# Patient Record
Sex: Female | Born: 1965 | Race: Black or African American | Hispanic: No | Marital: Single | Smoking: Current every day smoker
Health system: Southern US, Community
[De-identification: ages and names within clinical notes are randomized; demographics above are authoritative.]

## PROBLEM LIST (undated history)

## (undated) DIAGNOSIS — F329 Major depressive disorder, single episode, unspecified: Secondary | ICD-10-CM

## (undated) DIAGNOSIS — F1410 Cocaine abuse, uncomplicated: Secondary | ICD-10-CM

## (undated) DIAGNOSIS — E079 Disorder of thyroid, unspecified: Secondary | ICD-10-CM

## (undated) DIAGNOSIS — E785 Hyperlipidemia, unspecified: Secondary | ICD-10-CM

## (undated) DIAGNOSIS — M5126 Other intervertebral disc displacement, lumbar region: Secondary | ICD-10-CM

## (undated) DIAGNOSIS — E119 Type 2 diabetes mellitus without complications: Secondary | ICD-10-CM

## (undated) DIAGNOSIS — M51369 Other intervertebral disc degeneration, lumbar region without mention of lumbar back pain or lower extremity pain: Secondary | ICD-10-CM

## (undated) DIAGNOSIS — I10 Essential (primary) hypertension: Secondary | ICD-10-CM

## (undated) DIAGNOSIS — F32A Depression, unspecified: Secondary | ICD-10-CM

## (undated) DIAGNOSIS — E7800 Pure hypercholesterolemia, unspecified: Secondary | ICD-10-CM

## (undated) DIAGNOSIS — M5430 Sciatica, unspecified side: Secondary | ICD-10-CM

## (undated) DIAGNOSIS — M5136 Other intervertebral disc degeneration, lumbar region: Secondary | ICD-10-CM

## (undated) DIAGNOSIS — J449 Chronic obstructive pulmonary disease, unspecified: Secondary | ICD-10-CM

## (undated) DIAGNOSIS — K8020 Calculus of gallbladder without cholecystitis without obstruction: Secondary | ICD-10-CM

## (undated) HISTORY — PX: KNEE SURGERY: SHX244

## (undated) HISTORY — PX: TUBAL LIGATION: SHX77

## (undated) HISTORY — PX: HERNIA REPAIR: SHX51

---

## 2014-01-10 ENCOUNTER — Emergency Department (HOSPITAL_BASED_OUTPATIENT_CLINIC_OR_DEPARTMENT_OTHER): Payer: Medicaid Other

## 2014-01-10 ENCOUNTER — Encounter (HOSPITAL_BASED_OUTPATIENT_CLINIC_OR_DEPARTMENT_OTHER): Payer: Self-pay | Admitting: Emergency Medicine

## 2014-01-10 ENCOUNTER — Emergency Department (HOSPITAL_BASED_OUTPATIENT_CLINIC_OR_DEPARTMENT_OTHER)
Admission: EM | Admit: 2014-01-10 | Discharge: 2014-01-10 | Disposition: A | Discharge status: Home / Self Care | Payer: Medicaid Other | Attending: Emergency Medicine | Admitting: Emergency Medicine

## 2014-01-10 DIAGNOSIS — M545 Low back pain, unspecified: Secondary | ICD-10-CM | POA: Insufficient documentation

## 2014-01-10 DIAGNOSIS — E785 Hyperlipidemia, unspecified: Secondary | ICD-10-CM | POA: Insufficient documentation

## 2014-01-10 DIAGNOSIS — F329 Major depressive disorder, single episode, unspecified: Secondary | ICD-10-CM | POA: Insufficient documentation

## 2014-01-10 DIAGNOSIS — F17200 Nicotine dependence, unspecified, uncomplicated: Secondary | ICD-10-CM | POA: Insufficient documentation

## 2014-01-10 DIAGNOSIS — F3289 Other specified depressive episodes: Secondary | ICD-10-CM | POA: Insufficient documentation

## 2014-01-10 DIAGNOSIS — R109 Unspecified abdominal pain: Secondary | ICD-10-CM | POA: Insufficient documentation

## 2014-01-10 DIAGNOSIS — Z79899 Other long term (current) drug therapy: Secondary | ICD-10-CM | POA: Insufficient documentation

## 2014-01-10 DIAGNOSIS — M549 Dorsalgia, unspecified: Secondary | ICD-10-CM

## 2014-01-10 HISTORY — DX: Hyperlipidemia, unspecified: E78.5

## 2014-01-10 HISTORY — DX: Depression, unspecified: F32.A

## 2014-01-10 HISTORY — DX: Major depressive disorder, single episode, unspecified: F32.9

## 2014-01-10 LAB — COMPREHENSIVE METABOLIC PANEL
ALBUMIN: 4.2 g/dL (ref 3.5–5.2)
ALT: 40 U/L — ABNORMAL HIGH (ref 0–35)
AST: 45 U/L — AB (ref 0–37)
Alkaline Phosphatase: 84 U/L (ref 39–117)
BUN: 10 mg/dL (ref 6–23)
CALCIUM: 10.6 mg/dL — AB (ref 8.4–10.5)
CO2: 24 mEq/L (ref 19–32)
CREATININE: 0.8 mg/dL (ref 0.50–1.10)
Chloride: 100 mEq/L (ref 96–112)
GFR calc Af Amer: >90 mL/min (ref >90)
GFR calc non Af Amer: 85 mL/min — ABNORMAL LOW (ref >90)
Glucose, Bld: 97 mg/dL (ref 70–99)
Potassium: 3.9 mEq/L (ref 3.7–5.3)
Sodium: 137 mEq/L (ref 137–147)
Total Bilirubin: 0.5 mg/dL (ref 0.3–1.2)
Total Protein: 8.1 g/dL (ref 6.0–8.3)

## 2014-01-10 LAB — URINALYSIS, ROUTINE W REFLEX MICROSCOPIC
Bilirubin Urine: NEGATIVE
GLUCOSE, UA: NEGATIVE mg/dL
HGB URINE DIPSTICK: NEGATIVE
Ketones, ur: NEGATIVE mg/dL
Nitrite: NEGATIVE
Protein, ur: NEGATIVE mg/dL
SPECIFIC GRAVITY, URINE: 1.014 (ref 1.005–1.030)
Urobilinogen, UA: 0.2 mg/dL (ref 0.0–1.0)
pH: 6 (ref 5.0–8.0)

## 2014-01-10 LAB — CBC WITH DIFFERENTIAL/PLATELET
BASOS ABS: 0 10*3/uL (ref 0.0–0.1)
BASOS PCT: 0 % (ref 0–1)
EOS PCT: 1 % (ref 0–5)
Eosinophils Absolute: 0.1 10*3/uL (ref 0.0–0.7)
HEMATOCRIT: 41.8 % (ref 36.0–46.0)
Hemoglobin: 14.4 g/dL (ref 12.0–15.0)
Lymphocytes Relative: 43 % (ref 12–46)
Lymphs Abs: 4.6 10*3/uL — ABNORMAL HIGH (ref 0.7–4.0)
MCH: 29.1 pg (ref 26.0–34.0)
MCHC: 34.4 g/dL (ref 30.0–36.0)
MCV: 84.6 fL (ref 78.0–100.0)
MONO ABS: 1 10*3/uL (ref 0.1–1.0)
Monocytes Relative: 10 % (ref 3–12)
NEUTROS ABS: 4.9 10*3/uL (ref 1.7–7.7)
Neutrophils Relative %: 46 % (ref 43–77)
Platelets: 307 10*3/uL (ref 150–400)
RBC: 4.94 MIL/uL (ref 3.87–5.11)
RDW: 16.6 % — AB (ref 11.5–15.5)
WBC: 10.7 10*3/uL — ABNORMAL HIGH (ref 4.0–10.5)

## 2014-01-10 LAB — URINE MICROSCOPIC-ADD ON

## 2014-01-10 MED ORDER — METHOCARBAMOL 100 MG/ML IJ SOLN
1000.0000 mg | Freq: Once | INTRAMUSCULAR | Status: AC
  Administered 2014-01-10: 1000 mg via INTRAMUSCULAR
  Filled 2014-01-10: qty 10

## 2014-01-10 MED ORDER — ONDANSETRON HCL 4 MG/2ML IJ SOLN
4.0000 mg | Freq: Once | INTRAMUSCULAR | Status: AC
  Administered 2014-01-10: 4 mg via INTRAVENOUS
  Filled 2014-01-10: qty 2

## 2014-01-10 MED ORDER — HYDROCODONE-ACETAMINOPHEN 5-325 MG PO TABS: 1.0000 | ORAL_TABLET | ORAL | Status: DC | PRN

## 2014-01-10 MED ORDER — CYCLOBENZAPRINE HCL 10 MG PO TABS: 10.0000 mg | ORAL_TABLET | Freq: Two times a day (BID) | ORAL | Status: DC | PRN

## 2014-01-10 MED ORDER — HYDROMORPHONE HCL PF 1 MG/ML IJ SOLN
0.5000 mg | Freq: Once | INTRAMUSCULAR | Status: AC
  Administered 2014-01-10: 0.5 mg via INTRAVENOUS
  Filled 2014-01-10: qty 1

## 2014-01-10 NOTE — ED Notes (Signed)
pa at bedside. 

## 2014-01-10 NOTE — ED Notes (Signed)
PA at bedside.

## 2014-01-10 NOTE — ED Notes (Signed)
Pt c/o lower back pain which radiates down left leg  X 1 day

## 2014-01-10 NOTE — Discharge Instructions (Signed)
Back Pain, Adult Low back pain is very common. About 1 in 5 people have back pain.The cause of low back pain is rarely dangerous. The pain often gets better over time.About half of people with a sudden onset of back pain feel better in just 2 weeks. About 8 in 10 people feel better by 6 weeks.  CAUSES Some common causes of back pain include:  Strain of the muscles or ligaments supporting the spine.  Wear and tear (degeneration) of the spinal discs.  Arthritis.  Direct injury to the back. DIAGNOSIS Most of the time, the direct cause of low back pain is not known.However, back pain can be treated effectively even when the exact cause of the pain is unknown.Answering your caregiver's questions about your overall health and symptoms is one of the most accurate ways to make sure the cause of your pain is not dangerous. If your caregiver needs more information, he or she may order lab work or imaging tests (X-rays or MRIs).However, even if imaging tests show changes in your back, this usually does not require surgery. HOME CARE INSTRUCTIONS For many people, back pain returns.Since low back pain is rarely dangerous, it is often a condition that people can learn to manageon their own.   Remain active. It is stressful on the back to sit or stand in one place. Do not sit, drive, or stand in one place for more than 30 minutes at a time. Take short walks on level surfaces as soon as pain allows.Try to increase the length of time you walk each day.  Do not stay in bed.Resting more than 1 or 2 days can delay your recovery.  Do not avoid exercise or work.Your body is made to move.It is not dangerous to be active, even though your back may hurt.Your back will likely heal faster if you return to being active before your pain is gone.  Pay attention to your body when you bend and lift. Many people have less discomfortwhen lifting if they bend their knees, keep the load close to their bodies,and  avoid twisting. Often, the most comfortable positions are those that put less stress on your recovering back.  Find a comfortable position to sleep. Use a firm mattress and lie on your side with your knees slightly bent. If you lie on your back, put a pillow under your knees.  Only take over-the-counter or prescription medicines as directed by your caregiver. Over-the-counter medicines to reduce pain and inflammation are often the most helpful.Your caregiver may prescribe muscle relaxant drugs.These medicines help dull your pain so you can more quickly return to your normal activities and healthy exercise.  Put ice on the injured area.  Put ice in a plastic bag.  Place a towel between your skin and the bag.  Leave the ice on for 15-20 minutes, 03-04 times a day for the first 2 to 3 days. After that, ice and heat may be alternated to reduce pain and spasms.  Ask your caregiver about trying back exercises and gentle massage. This may be of some benefit.  Avoid feeling anxious or stressed.Stress increases muscle tension and can worsen back pain.It is important to recognize when you are anxious or stressed and learn ways to manage it.Exercise is a great option. SEEK MEDICAL CARE IF:  You have pain that is not relieved with rest or medicine.  You have pain that does not improve in 1 week.  You have new symptoms.  You are generally not feeling well. SEEK   IMMEDIATE MEDICAL CARE IF:   You have pain that radiates from your back into your legs.  You develop new bowel or bladder control problems.  You have unusual weakness or numbness in your arms or legs.  You develop nausea or vomiting.  You develop abdominal pain.  You feel faint. Document Released: 09/08/2005 Document Revised: 03/09/2012 Document Reviewed: 01/27/2011 [employer] Patient Information 2014 [employer].  

## 2014-01-13 NOTE — ED Provider Notes (Signed)
CSN: [id number]     Arrival date & time 01/10/14  1740 History   First MD Initiated Contact with Patient 01/10/14 1759     Chief Complaint  Patient presents with  . Back Pain     (Consider location/radiation/quality/duration/timing/severity/associated sxs/prior Treatment) Patient is a 48 y.o. female presenting with back pain. The history is provided by the patient. No language interpreter was used.  Back Pain Worsened by:  Movement and ambulation Associated symptoms: no abdominal pain, no dysuria, no fever, no headaches and no numbness   Associated symptoms comment:  Left sided lower back and flank pain since yesterday. The pain moves into left lower extremity without numbness, or weakness. She denies known injury or history of back problems. No dysuria or fever.    Past Medical History  Diagnosis Date  . Hyperlipemia   . Depression    Past Surgical History  Procedure Laterality Date  . Tubal ligation     History reviewed. No pertinent family history. History  Substance Use Topics  . Smoking status: Current Every Day Smoker -- 0.50 packs/day    Types: Cigarettes  . Smokeless tobacco: Not on file  . Alcohol Use: No   OB History   Grav Para Term Preterm Abortions TAB SAB Ect Mult Living                 Review of Systems  Constitutional: Negative for fever and chills.  Respiratory: Negative.  Negative for cough and shortness of breath.   Cardiovascular: Negative.   Gastrointestinal: Negative.  Negative for nausea, vomiting and abdominal pain.  Genitourinary: Positive for flank pain. Negative for dysuria and hematuria.  Musculoskeletal: Positive for back pain.  Skin: Negative.   Neurological: Negative.  Negative for numbness and headaches.      Allergies  Asa; Bactrim; Flagyl; and Sulfa antibiotics  Home Medications   Prior to Admission medications   Medication Sig Start Date End Date Taking? Authorizing Provider  escitalopram (LEXAPRO) 10 MG tablet Take 10 mg  by mouth daily.   Yes Historical Provider, MD  simvastatin (ZOCOR) 20 MG tablet Take 20 mg by mouth daily.   Yes Historical Provider, MD  cyclobenzaprine (FLEXERIL) 10 MG tablet Take 1 tablet (10 mg total) by mouth 2 (two) times daily as needed for muscle spasms. 01/10/14   Normand Damron A Deunta Beneke, PA-C  HYDROcodone-acetaminophen (NORCO/VICODIN) 5-325 MG per tablet Take 1-2 tablets by mouth every 4 (four) hours as needed. 01/10/14   Blane Worthington A Timoth Schara, PA-C   BP 117/78  Pulse 94  Temp(Src) 98.7 F (37.1 C) (Oral)  Resp 16  Ht 5\' 3"  (1.6 m)  Wt 162 lb (73.483 kg)  BMI 28.70 kg/m2  SpO2 100%  LMP 12/18/2013 Physical Exam  Constitutional: She is oriented to person, place, and time. She appears well-developed and well-nourished.  HENT:  Head: Normocephalic.  Neck: Normal range of motion. Neck supple.  Cardiovascular: Normal rate and regular rhythm.   Pulmonary/Chest: Effort normal and breath sounds normal.  Abdominal: Soft. Bowel sounds are normal. There is no tenderness. There is no rebound and no guarding.  Genitourinary:  No distinct CVA tenderness.  Musculoskeletal: Normal range of motion.  Mildly tender left paralumbar area without swelling. No left sciatic tenderness. FROM lower extremities without increasing back pain.   Neurological: She is alert and oriented to person, place, and time.  Skin: Skin is warm and dry. No rash noted.  Psychiatric: She has a normal mood and affect.    ED Course  Procedures (including critical care time) Labs Review Labs Reviewed  CBC WITH DIFFERENTIAL - Abnormal; Notable for the following:    WBC 10.7 (*)    RDW 16.6 (*)    Lymphs Abs 4.6 (*)    All other components within normal limits  COMPREHENSIVE METABOLIC PANEL - Abnormal; Notable for the following:    Calcium 10.6 (*)    AST 45 (*)    ALT 40 (*)    GFR calc non Af Amer 85 (*)    All other components within normal limits  URINALYSIS, ROUTINE W REFLEX MICROSCOPIC - Abnormal; Notable for the  following:    Leukocytes, UA TRACE (*)    All other components within normal limits  URINE MICROSCOPIC-ADD ON - Abnormal; Notable for the following:    Bacteria, UA FEW (*)    All other components within normal limits   Ct Abdomen Pelvis Wo Contrast  01/10/2014   CLINICAL DATA:  BACK PAIN  EXAM: CT ABDOMEN AND PELVIS WITHOUT CONTRAST  TECHNIQUE: Multidetector CT imaging of the abdomen and pelvis was performed following the standard protocol without intravenous contrast.  COMPARISON:  None.  FINDINGS: The lung bases are unremarkable.  Noncontrast evaluation of the liver, spleen, adrenals, pancreas, kidneys is unremarkable.  The bowel is negative.  Within the limitations of a noncontrast CT there is no evidence of abdominal or pelvic free fluid, loculated fluid collections, masses, nor adenopathy. There is no evidence of an abdominal aortic aneurysm. No abdominal wall nor inguinal hernia is appreciated.  There are are no aggressive appearing osseous lesions.  Atherosclerotic calcifications are identified within the aorta.  IMPRESSION: Within the limitations of noncontrast CT there is no evidence of abdominal or pelvic pathology. No CT evidence accounting for the patient's clinical presentation.   Electronically Signed   By: Salome HolmesHector  Cooper M.D.   On: 01/10/2014 21:00   Imaging Review No results found.   EKG Interpretation None      MDM   Final diagnoses:  Back pain   No CT evidence ureteral stone. The pain is worse with movement. She is afebrile and pain is improved in ED. Feel symptoms are musculoskeletal in nature.      Arnoldo HookerShari A Aneesh Faller, PA-C 01/14/14 2343

## 2014-01-17 NOTE — ED Provider Notes (Signed)
History/physical exam/procedure(s) were performed by non-physician practitioner and as supervising physician I was immediately available for consultation/collaboration. I have reviewed all notes and am in agreement with care and plan.   Hilario Quarryanielle S Martyna Thorns, MD 01/17/14 1341

## 2014-05-02 ENCOUNTER — Emergency Department (HOSPITAL_BASED_OUTPATIENT_CLINIC_OR_DEPARTMENT_OTHER)
Admission: EM | Admit: 2014-05-02 | Discharge: 2014-05-02 | Disposition: A | Discharge status: Home / Self Care | Payer: Medicaid Other | Attending: Emergency Medicine | Admitting: Emergency Medicine

## 2014-05-02 ENCOUNTER — Emergency Department (HOSPITAL_BASED_OUTPATIENT_CLINIC_OR_DEPARTMENT_OTHER): Payer: Medicaid Other

## 2014-05-02 ENCOUNTER — Encounter (HOSPITAL_BASED_OUTPATIENT_CLINIC_OR_DEPARTMENT_OTHER): Payer: Self-pay | Admitting: Emergency Medicine

## 2014-05-02 DIAGNOSIS — F3289 Other specified depressive episodes: Secondary | ICD-10-CM | POA: Diagnosis not present

## 2014-05-02 DIAGNOSIS — Y92009 Unspecified place in unspecified non-institutional (private) residence as the place of occurrence of the external cause: Secondary | ICD-10-CM | POA: Diagnosis not present

## 2014-05-02 DIAGNOSIS — S300XXA Contusion of lower back and pelvis, initial encounter: Secondary | ICD-10-CM | POA: Diagnosis not present

## 2014-05-02 DIAGNOSIS — Z79899 Other long term (current) drug therapy: Secondary | ICD-10-CM | POA: Diagnosis not present

## 2014-05-02 DIAGNOSIS — R296 Repeated falls: Secondary | ICD-10-CM | POA: Insufficient documentation

## 2014-05-02 DIAGNOSIS — W19XXXA Unspecified fall, initial encounter: Secondary | ICD-10-CM

## 2014-05-02 DIAGNOSIS — Y939 Activity, unspecified: Secondary | ICD-10-CM | POA: Insufficient documentation

## 2014-05-02 DIAGNOSIS — F17200 Nicotine dependence, unspecified, uncomplicated: Secondary | ICD-10-CM | POA: Diagnosis not present

## 2014-05-02 DIAGNOSIS — IMO0002 Reserved for concepts with insufficient information to code with codable children: Secondary | ICD-10-CM | POA: Insufficient documentation

## 2014-05-02 DIAGNOSIS — F329 Major depressive disorder, single episode, unspecified: Secondary | ICD-10-CM | POA: Insufficient documentation

## 2014-05-02 DIAGNOSIS — E785 Hyperlipidemia, unspecified: Secondary | ICD-10-CM | POA: Insufficient documentation

## 2014-05-02 MED ORDER — OXYCODONE-ACETAMINOPHEN 5-325 MG PO TABS: 1.0000 | ORAL_TABLET | Freq: Four times a day (QID) | ORAL | Status: DC | PRN

## 2014-05-02 MED ORDER — OXYCODONE-ACETAMINOPHEN 5-325 MG PO TABS
1.0000 | ORAL_TABLET | Freq: Once | ORAL | Status: AC
  Administered 2014-05-02: 1 via ORAL
  Filled 2014-05-02: qty 1

## 2014-05-02 NOTE — Discharge Instructions (Signed)
Return to the ED with any concerns including increased pain, weakness of legs, not able to bear weight, decreased level of alertness/lethargy, or any other alarming symptoms

## 2014-05-02 NOTE — ED Provider Notes (Signed)
CSN: [id number]     Arrival date & time 05/02/14  1629 History   First MD Initiated Contact with Patient 05/02/14 1823     This chart was scribed for Ethelda ChickMartha K Linker, MD by Arlan OrganAshley Leger, ED Scribe. This patient was seen in room MH04/MH04 and the patient's care was started 6:37 PM.   Chief Complaint  Patient presents with  . Fall   The history is provided by the patient. No language interpreter was used.    HPI Comments: Judy Schmidt is a 48 y.o. female with a PMHx of hyperlipidemia and depression who presents to the Emergency Department complaining of a fall that occurred just prior to arrival. Pt states she was in her kitchen when she felt unsteady resulting in her falling. Pt now c/o constant, moderate pain over the R buttock/R hip, and back that is unchanged at this time. She denies any fever or chills currently or at time of fall. No numbness, loss of sensation, or paresthesia. She has tried prescribed Hydrocodone for previous chronic pain without any improvement for symptoms. Last dose around noon today. Pt admits to feeling unbalanced and unsteady intermittently onset April 2015. PCP is aware of new symptoms but unaware of diagnosis at this time. Pt reports undergoing multiple X-ray, brain scan, bone scan, and MRI for this concern without any abnormal findings; still awaiting brain scan results. Pt with known allergy to ASA, Bactrim, Flagyl, and Sulfa Antibiotics. No other concerns this visit.    Past Medical History  Diagnosis Date  . Hyperlipemia   . Depression    Past Surgical History  Procedure Laterality Date  . Tubal ligation     No family history on file. History  Substance Use Topics  . Smoking status: Current Every Day Smoker -- 0.50 packs/day    Types: Cigarettes  . Smokeless tobacco: Not on file  . Alcohol Use: No   OB History   Grav Para Term Preterm Abortions TAB SAB Ect Mult Living                 Review of Systems  Constitutional: Negative for fever and  chills.  Musculoskeletal: Positive for arthralgias and back pain.  Neurological: Negative for numbness.  ROS reviewed and all otherwise negative except for mentioned in HPI    Allergies  Asa; Bactrim; Flagyl; and Sulfa antibiotics  Home Medications   Prior to Admission medications   Medication Sig Start Date End Date Taking? Authorizing Provider  clotrimazole (LOTRIMIN) 1 % cream Apply 1 application topically 2 (two) times daily.   Yes Historical Provider, MD  Pregabalin (LYRICA PO) Take by mouth.   Yes Historical Provider, MD  cyclobenzaprine (FLEXERIL) 10 MG tablet Take 1 tablet (10 mg total) by mouth 2 (two) times daily as needed for muscle spasms. 01/10/14   Shari A Upstill, PA-C  escitalopram (LEXAPRO) 10 MG tablet Take 10 mg by mouth daily.    Historical Provider, MD  HYDROcodone-acetaminophen (NORCO/VICODIN) 5-325 MG per tablet Take 1-2 tablets by mouth every 4 (four) hours as needed. 01/10/14   Shari A Upstill, PA-C  oxyCODONE-acetaminophen (PERCOCET/ROXICET) 5-325 MG per tablet Take 1-2 tablets by mouth every 6 (six) hours as needed for severe pain. 05/02/14   Ethelda ChickMartha K Linker, MD  simvastatin (ZOCOR) 20 MG tablet Take 20 mg by mouth daily.    Historical Provider, MD   Triage Vitals: BP 125/65  Pulse 98  Temp(Src) 99.1 F (37.3 C) (Oral)  Resp 20  Ht 5\' 3"  (1.6 m)  Wt 164 lb (74.39 kg)  BMI 29.06 kg/m2  SpO2 100%  LMP 04/11/2014   Physical Exam  Nursing note and vitals reviewed. Constitutional: She is oriented to person, place, and time. She appears well-developed and well-nourished.  HENT:  Head: Normocephalic.  Eyes: EOM are normal.  Neck: Normal range of motion.  Pulmonary/Chest: Effort normal.  Abdominal: She exhibits no distension.  Musculoskeletal: Normal range of motion. She exhibits tenderness.  No pain with ROM of hip or knee Mild midline lumbar tenderness; chronic per pt  Neurological: She is alert and oriented to person, place, and time.  Skin:  R bottck  with 4 cm diameter area of contusion and induration with superficial abrasion overlying  Psychiatric: She has a normal mood and affect.    ED Course  Procedures (including critical care time)  DIAGNOSTIC STUDIES: Oxygen Saturation is 100% on RA, Normal by my interpretation.    COORDINATION OF CARE: 6:45 PM- Will give Percocet. Will order DG pelvis 1-2 views and DG lumbar spine complete. Discussed treatment plan with pt at bedside and pt agreed to plan.     Labs Review Labs Reviewed - No data to display  Imaging Review Dg Lumbar Spine Complete  05/02/2014   CLINICAL DATA:  Status post fall now with right buttock and low back pain; patient has numbness in the left leg.  EXAM: LUMBAR SPINE - COMPLETE 4+ VIEW  COMPARISON:  AP view of the pelvis of today's date and a nuclear bone scan dated April 04, 2014.  FINDINGS: The lumbar vertebral bodies are preserved in height. The intervertebral disc space heights are well maintained. There is no spondylolisthesis. The pedicles and transverse processes are intact. The observed portions of the sacrum are normal.  IMPRESSION: There is no acute bony abnormality of the lumbar spine.   Electronically Signed   By: David  [place]   On: 05/02/2014 19:30   Dg Pelvis 1-2 Views  05/02/2014   CLINICAL DATA:  Status post fall with persistent right buttock pain; also numbness in the right leg  EXAM: PELVIS - 1-2 VIEW  COMPARISON:  Lumbar spine series of today's date and a nuclear bone scan of April 04, 2014  FINDINGS: The bony pelvis is adequately mineralized. There is no acute fracture nor dislocation. The hip joint spaces are reasonably well maintained. The observed portions of the proximal femurs are normal. The sacrum exhibits no acute abnormality.  Specific attention to the left hemipelvis in the region of focal increased uptake in the superior pubic ramus reveals no plain film abnormality.  IMPRESSION: There is no acute bony abnormality of the pelvis.   Electronically  Signed   By: David  [place]   On: 05/02/2014 19:25     EKG Interpretation None      MDM   Final diagnoses:  Contusion of buttock, initial encounter  Fall, initial encounter    Pt presenting fall with contusion on buttock.  xrays reassuring.  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  She states this is chronic in nature but does lead to her having falls.  Discharged with strict return precautions.  Pt agreeable with plan.  Nursing notes including past medical history and social history reviewed and considered in documentation   I personally performed the services described in this documentation, which was scribed in my presence. The recorded information has been reviewed and is accurate.    Ethelda Chick, MD 05/03/14 [phone]

## 2014-05-02 NOTE — ED Notes (Signed)
Pt reports hx"serious problems with my mobility" and multiple falls-pain right buttock and hip area

## 2014-06-13 ENCOUNTER — Encounter (HOSPITAL_BASED_OUTPATIENT_CLINIC_OR_DEPARTMENT_OTHER): Payer: Self-pay | Admitting: Emergency Medicine

## 2014-06-13 ENCOUNTER — Emergency Department (HOSPITAL_BASED_OUTPATIENT_CLINIC_OR_DEPARTMENT_OTHER): Payer: Medicaid Other

## 2014-06-13 ENCOUNTER — Emergency Department (HOSPITAL_BASED_OUTPATIENT_CLINIC_OR_DEPARTMENT_OTHER)
Admission: EM | Admit: 2014-06-13 | Discharge: 2014-06-13 | Disposition: A | Discharge status: Home / Self Care | Payer: Medicaid Other | Attending: Emergency Medicine | Admitting: Emergency Medicine

## 2014-06-13 DIAGNOSIS — E8779 Other fluid overload: Secondary | ICD-10-CM | POA: Diagnosis not present

## 2014-06-13 DIAGNOSIS — E785 Hyperlipidemia, unspecified: Secondary | ICD-10-CM | POA: Diagnosis not present

## 2014-06-13 DIAGNOSIS — Z79899 Other long term (current) drug therapy: Secondary | ICD-10-CM | POA: Diagnosis not present

## 2014-06-13 DIAGNOSIS — R5381 Other malaise: Secondary | ICD-10-CM | POA: Diagnosis not present

## 2014-06-13 DIAGNOSIS — IMO0002 Reserved for concepts with insufficient information to code with codable children: Secondary | ICD-10-CM | POA: Diagnosis not present

## 2014-06-13 DIAGNOSIS — F329 Major depressive disorder, single episode, unspecified: Secondary | ICD-10-CM | POA: Diagnosis not present

## 2014-06-13 DIAGNOSIS — R55 Syncope and collapse: Secondary | ICD-10-CM | POA: Insufficient documentation

## 2014-06-13 DIAGNOSIS — M546 Pain in thoracic spine: Secondary | ICD-10-CM | POA: Diagnosis not present

## 2014-06-13 DIAGNOSIS — E7800 Pure hypercholesterolemia, unspecified: Secondary | ICD-10-CM | POA: Diagnosis not present

## 2014-06-13 DIAGNOSIS — E8770 Fluid overload, unspecified: Secondary | ICD-10-CM

## 2014-06-13 DIAGNOSIS — M7989 Other specified soft tissue disorders: Secondary | ICD-10-CM | POA: Diagnosis present

## 2014-06-13 DIAGNOSIS — R5383 Other fatigue: Secondary | ICD-10-CM

## 2014-06-13 DIAGNOSIS — R51 Headache: Secondary | ICD-10-CM | POA: Diagnosis not present

## 2014-06-13 DIAGNOSIS — M5430 Sciatica, unspecified side: Secondary | ICD-10-CM | POA: Diagnosis not present

## 2014-06-13 DIAGNOSIS — M542 Cervicalgia: Secondary | ICD-10-CM | POA: Insufficient documentation

## 2014-06-13 DIAGNOSIS — F3289 Other specified depressive episodes: Secondary | ICD-10-CM | POA: Insufficient documentation

## 2014-06-13 DIAGNOSIS — F17200 Nicotine dependence, unspecified, uncomplicated: Secondary | ICD-10-CM | POA: Insufficient documentation

## 2014-06-13 DIAGNOSIS — M5410 Radiculopathy, site unspecified: Secondary | ICD-10-CM

## 2014-06-13 DIAGNOSIS — M5442 Lumbago with sciatica, left side: Secondary | ICD-10-CM

## 2014-06-13 DIAGNOSIS — M5441 Lumbago with sciatica, right side: Secondary | ICD-10-CM

## 2014-06-13 HISTORY — DX: Disorder of thyroid, unspecified: E07.9

## 2014-06-13 HISTORY — DX: Pure hypercholesterolemia, unspecified: E78.00

## 2014-06-13 LAB — CBC WITH DIFFERENTIAL/PLATELET
BASOS ABS: 0 10*3/uL (ref 0.0–0.1)
Basophils Relative: 0 % (ref 0–1)
EOS ABS: 0.2 10*3/uL (ref 0.0–0.7)
Eosinophils Relative: 2 % (ref 0–5)
HCT: 35.1 % — ABNORMAL LOW (ref 36.0–46.0)
Hemoglobin: 11.8 g/dL — ABNORMAL LOW (ref 12.0–15.0)
Lymphocytes Relative: 39 % (ref 12–46)
Lymphs Abs: 3.8 10*3/uL (ref 0.7–4.0)
MCH: 29 pg (ref 26.0–34.0)
MCHC: 33.6 g/dL (ref 30.0–36.0)
MCV: 86.2 fL (ref 78.0–100.0)
MONO ABS: 0.7 10*3/uL (ref 0.1–1.0)
Monocytes Relative: 7 % (ref 3–12)
NEUTROS ABS: 5.1 10*3/uL (ref 1.7–7.7)
Neutrophils Relative %: 52 % (ref 43–77)
Platelets: 328 10*3/uL (ref 150–400)
RBC: 4.07 MIL/uL (ref 3.87–5.11)
RDW: 14.7 % (ref 11.5–15.5)
WBC: 9.8 10*3/uL (ref 4.0–10.5)

## 2014-06-13 LAB — PRO B NATRIURETIC PEPTIDE: Pro B Natriuretic peptide (BNP): 317.9 pg/mL — ABNORMAL HIGH (ref 0–125)

## 2014-06-13 LAB — COMPREHENSIVE METABOLIC PANEL
ALBUMIN: 3.5 g/dL (ref 3.5–5.2)
ALT: 47 U/L — ABNORMAL HIGH (ref 0–35)
ANION GAP: 14 (ref 5–15)
AST: 61 U/L — ABNORMAL HIGH (ref 0–37)
Alkaline Phosphatase: 86 U/L (ref 39–117)
BILIRUBIN TOTAL: 0.3 mg/dL (ref 0.3–1.2)
BUN: 9 mg/dL (ref 6–23)
CALCIUM: 10 mg/dL (ref 8.4–10.5)
CHLORIDE: 102 meq/L (ref 96–112)
CO2: 25 mEq/L (ref 19–32)
CREATININE: 0.8 mg/dL (ref 0.50–1.10)
GFR calc Af Amer: >90 mL/min (ref >90)
GFR calc non Af Amer: 85 mL/min — ABNORMAL LOW (ref >90)
Glucose, Bld: 87 mg/dL (ref 70–99)
Potassium: 3.9 mEq/L (ref 3.7–5.3)
Sodium: 141 mEq/L (ref 137–147)
TOTAL PROTEIN: 7.5 g/dL (ref 6.0–8.3)

## 2014-06-13 LAB — URINALYSIS, ROUTINE W REFLEX MICROSCOPIC
Bilirubin Urine: NEGATIVE
Glucose, UA: NEGATIVE mg/dL
Ketones, ur: NEGATIVE mg/dL
NITRITE: NEGATIVE
Protein, ur: NEGATIVE mg/dL
SPECIFIC GRAVITY, URINE: 1.015 (ref 1.005–1.030)
UROBILINOGEN UA: 0.2 mg/dL (ref 0.0–1.0)
pH: 6 (ref 5.0–8.0)

## 2014-06-13 LAB — URINE MICROSCOPIC-ADD ON

## 2014-06-13 LAB — TROPONIN I

## 2014-06-13 MED ORDER — IBUPROFEN 800 MG PO TABS
800.0000 mg | ORAL_TABLET | Freq: Once | ORAL | Status: AC
  Administered 2014-06-13: 800 mg via ORAL
  Filled 2014-06-13: qty 1

## 2014-06-13 NOTE — Discharge Instructions (Signed)
Vertigo Vertigo means you feel like you are moving when you are not. Vertigo can make you feel like things around you are moving when they are not. This problem often goes away on its own.  HOME CARE   Follow your doctor's instructions.  Avoid driving.  Avoid using heavy machinery.  Avoid doing any activity that could be dangerous if you have a vertigo attack.  Tell your doctor if a medicine seems to cause your vertigo. GET HELP RIGHT AWAY IF:   Your medicines do not help or make you feel worse.  You have trouble talking or walking.  You feel weak or have trouble using your arms, hands, or legs.  You have bad headaches.  You keep feeling sick to your stomach (nauseous) or throwing up (vomiting).  Your vision changes.  A family member notices changes in your behavior.  Your problems get worse. MAKE SURE YOU:  Understand these instructions.  Will watch your condition.  Will get help right away if you are not doing well or get worse. Document Released: 06/17/2008 Document Revised: 12/01/2011 Document Reviewed: 03/27/2011 [facility] Patient Information 2015 [place], [place]. This information is not intended to replace advice given to you by your health care provider. Make sure you discuss any questions you have with your health care provider. Radicular Pain Radicular pain in either the arm or leg is usually from a bulging or herniated disk in the spine. A piece of the herniated disk may press against the nerves as the nerves exit the spine. This causes pain which is felt at the tips of the nerves down the arm or leg. Other causes of radicular pain may include:  Fractures.  Heart disease.  Cancer.  An abnormal and usually degenerative state of the nervous system or nerves (neuropathy). Diagnosis may require CT or MRI scanning to determine the primary cause.  Nerves that start at the neck (nerve roots) may cause radicular pain in the outer shoulder and arm. It can spread down  to the thumb and fingers. The symptoms vary depending on which nerve root has been affected. In most cases radicular pain improves with conservative treatment. Neck problems may require physical therapy, a neck collar, or cervical traction. Treatment may take many weeks, and surgery may be considered if the symptoms do not improve.  Conservative treatment is also recommended for sciatica. Sciatica causes pain to radiate from the lower back or buttock area down the leg into the foot. Often there is a history of back problems. Most patients with sciatica are better after 2 to 4 weeks of rest and other supportive care. Short term bed rest can reduce the disk pressure considerably. Sitting, however, is not a good position since this increases the pressure on the disk. You should avoid bending, lifting, and all other activities which make the problem worse. Traction can be used in severe cases. Surgery is usually reserved for patients who do not improve within the first months of treatment. Only take over-the-counter or prescription medicines for pain, discomfort, or fever as directed by your caregiver. Narcotics and muscle relaxants may help by relieving more severe pain and spasm and by providing mild sedation. Cold or massage can give significant relief. Spinal manipulation is not recommended. It can increase the degree of disc protrusion. Epidural steroid injections are often effective treatment for radicular pain. These injections deliver medicine to the spinal nerve in the space between the protective covering of the spinal cord and back bones (vertebrae). Your caregiver can give  you more information about steroid injections. These injections are most effective when given within two weeks of the onset of pain.  You should see your caregiver for follow up care as recommended. A program for neck and back injury rehabilitation with stretching and strengthening exercises is an important part of management.  SEEK  IMMEDIATE MEDICAL CARE IF:  You develop increased pain, weakness, or numbness in your arm or leg.  You develop difficulty with bladder or bowel control.  You develop abdominal pain. Document Released: 10/16/2004 Document Revised: 12/01/2011 Document Reviewed: 01/01/2009 [facility] Patient Information 2015 [place], [place]. This information is not intended to replace advice given to you by your health care provider. Make sure you discuss any questions you have with your health care provider.

## 2014-06-13 NOTE — ED Provider Notes (Signed)
CSN: [id number]     Arrival date & time 06/13/14  1719 History  This chart was scribed for Mirian Mo, MD by Roxy Cedar, ED Scribe. This patient was seen in room MH03/MH03 and the patient's care was started at 6:27 PM.   Chief Complaint  Patient presents with  . Leg Swelling   Patient is a 48 y.o. female presenting with extremity weakness. The history is provided by the patient. No language interpreter was used.  Extremity Weakness This is a new problem. The current episode started 2 days ago. The problem occurs hourly. The problem has not changed since onset.Associated symptoms include headaches. Pertinent negatives include no chest pain, no abdominal pain and no shortness of breath. Associated symptoms comments: Right leg swelling. Tingling in left arm. Left sided numbness of neck and back.. Nothing aggravates the symptoms. Nothing relieves the symptoms. She has tried nothing for the symptoms.   HPI Comments: Judy Schmidt is a 48 y.o. female with a history of depression, schizophrenia, high cholesterol, thyroid disease, hyperlipidemia, and a history of falls, who presents to the Emergency Department complaining of moderate cough and constant left sided pain and tingling from her "neck, into her arm and back" for the past 2 weeks worsening over the last 2 days. She has a ho gait disturbance and vague and intermittent symptoms for which she has seen a neurologist in the past and within the last 2 months has had numerous CT scans of her head, an MRI of her head and her lumbar spine which were significant for bulging discs and sciatica.  She is currently seeking a second opinion to find the source of her problems.  Patient denies chest pain, however has had intermittent tingling in her left arm. She also complains of balance problems and states that she will drop things out of grip for the past few months. She also complains of intermittent "head pains". She denies associated fever, nausea, or  vomiting. Per patient, the last time the patient had a fall was 2 weeks ago, without new symptoms since that time.  Past Medical History  Diagnosis Date  . Hyperlipemia   . Depression   . High cholesterol   . Thyroid disease    Past Surgical History  Procedure Laterality Date  . Tubal ligation    . Knee surgery     No family history on file. History  Substance Use Topics  . Smoking status: Current Every Day Smoker -- 0.50 packs/day    Types: Cigarettes  . Smokeless tobacco: Never Used  . Alcohol Use: 0.5 oz/week    1 drink(s) per week     Comment: RARE   OB History   Grav Para Term Preterm Abortions TAB SAB Ect Mult Living                 Review of Systems  Constitutional: Negative for fever.  Respiratory: Negative for shortness of breath.   Cardiovascular: Negative for chest pain.  Gastrointestinal: Negative for nausea, vomiting and abdominal pain.  Genitourinary:       Urinary incontinence  Musculoskeletal: Positive for back pain, extremity weakness and neck pain.  Neurological: Positive for numbness and headaches.  All other systems reviewed and are negative.  Allergies  Asa; Bactrim; Flagyl; and Sulfa antibiotics  Home Medications   Prior to Admission medications   Medication Sig Start Date End Date Taking? Authorizing Provider  baclofen (LIORESAL) 10 MG tablet Take 10 mg by mouth 3 (three) times daily.   Yes  Historical Provider, MD  cyclobenzaprine (FLEXERIL) 10 MG tablet Take 1 tablet (10 mg total) by mouth 2 (two) times daily as needed for muscle spasms. 01/10/14  Yes Shari A Upstill, PA-C  escitalopram (LEXAPRO) 10 MG tablet Take 10 mg by mouth daily.   Yes Historical Provider, MD  HYDROcodone-acetaminophen (NORCO/VICODIN) 5-325 MG per tablet Take 1-2 tablets by mouth every 4 (four) hours as needed. 01/10/14  Yes Shari A Upstill, PA-C  Pregabalin (LYRICA PO) Take by mouth.   Yes Historical Provider, MD  simvastatin (ZOCOR) 20 MG tablet Take 20 mg by mouth  daily.   Yes Historical Provider, MD  clotrimazole (LOTRIMIN) 1 % cream Apply 1 application topically 2 (two) times daily.    Historical Provider, MD  oxyCODONE-acetaminophen (PERCOCET/ROXICET) 5-325 MG per tablet Take 1-2 tablets by mouth every 6 (six) hours as needed for severe pain. 05/02/14   Ethelda Chick, MD   Triage Vitals: BP 148/74  Pulse 92  Temp(Src) 98.8 F (37.1 C) (Oral)  Resp 20  Ht  (1.6 m)  Wt 164 lb (74.39 kg)  BMI 29.06 kg/m2  SpO2 98%  LMP 06/08/2014  Physical Exam  Nursing note and vitals reviewed. Constitutional: She is oriented to person, place, and time. She appears well-developed and well-nourished. No distress.  HENT:  Head: Normocephalic and atraumatic.  Right Ear: External ear normal.  Left Ear: External ear normal.  Mouth/Throat: Oropharynx is clear and moist. No oropharyngeal exudate.  Eyes: Conjunctivae and EOM are normal. Pupils are equal, round, and reactive to light.  Neck: Normal range of motion. Neck supple. No tracheal deviation present.  Cardiovascular: Normal rate, regular rhythm, normal heart sounds and intact distal pulses.   Pulmonary/Chest: Effort normal and breath sounds normal. No respiratory distress. She has no rales.  Abdominal: Soft. Bowel sounds are normal. There is no tenderness.  Musculoskeletal: Normal range of motion. She exhibits tenderness.       Right lower leg: She exhibits no edema.       Left lower leg: She exhibits no edema.       Right foot: She exhibits swelling.       Left foot: She exhibits swelling.  Diffuse spinal tenderness.  Reproduction of symptoms with hip flexion bilaterally  Neurological: She is alert and oriented to person, place, and time. She has normal strength and normal reflexes. A sensory deficit (subjective decrease in LLE, UE unremarkable) is present. No cranial nerve deficit. Gait normal.  Skin: Skin is warm and dry.  Psychiatric: She has a normal mood and affect. Her behavior is normal.    ED Course  Procedures (including critical care time)  DIAGNOSTIC STUDIES: Oxygen Saturation is 98% on RA, normal by my interpretation.    COORDINATION OF CARE: 6:42 PM- Advised patient to take a baby aspirin once daily. Discussed with patient that there are no concerns for a stroke. Pt advised of plan for treatment and pt agrees.  Labs Review Labs Reviewed  CBC WITH DIFFERENTIAL - Abnormal; Notable for the following:    Hemoglobin 11.8 (*)    HCT 35.1 (*)    All other components within normal limits  COMPREHENSIVE METABOLIC PANEL - Abnormal; Notable for the following:    AST 61 (*)    ALT 47 (*)    GFR calc non Af Amer 85 (*)    All other components within normal limits  URINALYSIS, ROUTINE W REFLEX MICROSCOPIC - Abnormal; Notable for the following:    Hgb urine dipstick TRACE (*)  Leukocytes, UA TRACE (*)    All other components within normal limits  PRO B NATRIURETIC PEPTIDE - Abnormal; Notable for the following:    Pro B Natriuretic peptide (BNP) 317.9 (*)    All other components within normal limits  TROPONIN I  URINE MICROSCOPIC-ADD ON    Imaging Review Dg Chest 2 View  06/13/2014   CLINICAL DATA:  Leg swelling, chest pain  EXAM: CHEST  2 VIEW  COMPARISON:  None.  FINDINGS: The heart size and mediastinal contours are within normal limits. Both lungs are clear. The visualized skeletal structures are unremarkable.  IMPRESSION: No active cardiopulmonary disease.   Electronically Signed   By: Elige Ko   On: 06/13/2014 19:27     EKG Interpretation   Date/Time:  Tuesday June 13 2014 19:07:33 EDT Ventricular Rate:  86 PR Interval:  142 QRS Duration: 76 QT Interval:  354 QTC Calculation: 423 R Axis:   52 Text Interpretation:  Normal sinus rhythm Early repolarization Normal ECG  No old tracing to compare Confirmed by Mirian Mo [phone]) on  06/13/2014 8:43:29 PM     MDM   Final diagnoses:  Bilateral low back pain with sciatica, sciatica laterality  unspecified  Bilateral thoracic back pain  Radicular pain  Hypervolemia, unspecified hypervolemia type    48 y.o. female with pertinent PMH of depression, schizophrenia, HLD presents with an assortment of neurologic and pain complaints over the last few months for which she has had extensive workup by other physicians including neurology and has had negative MRI of head, neck, and back.  The pt states that her new symptoms since last imaging are intermittent strength loss in LUE and sensory loss in LLE, she denies the converse symptoms.  No recent fevers, however has had some increased back pain in absence of trauma.  She also states that a cough is new.  She concurrently denies any new symptoms in the last 48 hours.  On arrival the pt has vitals and physical exam as above.  Examination and history consistent with radicular pain and symptoms, with exception of cough, and pt denies new symptoms concerning for cauda equina since last MR of spine.  Additionally, she has had no trauma to necessitate repeat imaging of spine.  She also denies worsening headache or gait problems since MRI of head.    As pt has unremarkable wu for cough and symptoms fitting with radicular pain with known spinal disc etiology, consider this likely etiology of symptoms with intermittent subj sensory loss and no objective weakness.  Pt was given strict return precautions for worsening symptoms or signs of CVA, cauda equina, or ACS, voiced understanding, and agreed to fu. She additionally will fu with cardiology as she has a mild elevation in her BNP, however ankle swelling is mild and she has no signs of respiratory failure.    1. Bilateral low back pain with sciatica, sciatica laterality unspecified   2. Bilateral thoracic back pain   3. Radicular pain   4. Hypervolemia, unspecified hypervolemia type       I personally performed the services described in this documentation, which was scribed in my presence. The recorded  information has been reviewed and is accurate.    Mirian Mo, MD 06/14/14 [phone]

## 2014-06-13 NOTE — ED Notes (Signed)
Pt report bilateral leg swelling x 2 days- also c/o left side pain "for a while", worse x 2 days, also c/o "bad cough" x  2 weeks - reports she called her PCP and was advised to come to ED

## 2014-06-13 NOTE — ED Notes (Signed)
Patient attempted to walk to the restroom. Witness by others patient almost fell, was placed into wheelchair without injury.

## 2014-07-13 ENCOUNTER — Emergency Department (HOSPITAL_BASED_OUTPATIENT_CLINIC_OR_DEPARTMENT_OTHER)
Admission: EM | Admit: 2014-07-13 | Discharge: 2014-07-13 | Disposition: A | Discharge status: Home / Self Care | Payer: Medicaid Other | Attending: Emergency Medicine | Admitting: Emergency Medicine

## 2014-07-13 ENCOUNTER — Encounter (HOSPITAL_BASED_OUTPATIENT_CLINIC_OR_DEPARTMENT_OTHER): Payer: Self-pay | Admitting: Emergency Medicine

## 2014-07-13 ENCOUNTER — Emergency Department (HOSPITAL_BASED_OUTPATIENT_CLINIC_OR_DEPARTMENT_OTHER): Payer: Medicaid Other

## 2014-07-13 DIAGNOSIS — R1031 Right lower quadrant pain: Secondary | ICD-10-CM | POA: Diagnosis present

## 2014-07-13 DIAGNOSIS — F329 Major depressive disorder, single episode, unspecified: Secondary | ICD-10-CM | POA: Insufficient documentation

## 2014-07-13 DIAGNOSIS — E785 Hyperlipidemia, unspecified: Secondary | ICD-10-CM | POA: Insufficient documentation

## 2014-07-13 DIAGNOSIS — R109 Unspecified abdominal pain: Secondary | ICD-10-CM

## 2014-07-13 DIAGNOSIS — Z79899 Other long term (current) drug therapy: Secondary | ICD-10-CM | POA: Insufficient documentation

## 2014-07-13 DIAGNOSIS — K4090 Unilateral inguinal hernia, without obstruction or gangrene, not specified as recurrent: Secondary | ICD-10-CM | POA: Insufficient documentation

## 2014-07-13 DIAGNOSIS — Z720 Tobacco use: Secondary | ICD-10-CM | POA: Diagnosis not present

## 2014-07-13 DIAGNOSIS — Z9851 Tubal ligation status: Secondary | ICD-10-CM | POA: Insufficient documentation

## 2014-07-13 LAB — CBC WITH DIFFERENTIAL/PLATELET
BASOS ABS: 0 10*3/uL (ref 0.0–0.1)
BASOS PCT: 0 % (ref 0–1)
EOS ABS: 0.2 10*3/uL (ref 0.0–0.7)
Eosinophils Relative: 2 % (ref 0–5)
HEMATOCRIT: 43.7 % (ref 36.0–46.0)
Hemoglobin: 14.3 g/dL (ref 12.0–15.0)
Lymphocytes Relative: 36 % (ref 12–46)
Lymphs Abs: 3.6 10*3/uL (ref 0.7–4.0)
MCH: 28.3 pg (ref 26.0–34.0)
MCHC: 32.7 g/dL (ref 30.0–36.0)
MCV: 86.4 fL (ref 78.0–100.0)
Monocytes Absolute: 0.8 10*3/uL (ref 0.1–1.0)
Monocytes Relative: 8 % (ref 3–12)
NEUTROS ABS: 5.5 10*3/uL (ref 1.7–7.7)
Neutrophils Relative %: 54 % (ref 43–77)
Platelets: 353 10*3/uL (ref 150–400)
RBC: 5.06 MIL/uL (ref 3.87–5.11)
RDW: 14.8 % (ref 11.5–15.5)
WBC: 10.1 10*3/uL (ref 4.0–10.5)

## 2014-07-13 LAB — BASIC METABOLIC PANEL
Anion gap: 12 (ref 5–15)
BUN: 8 mg/dL (ref 6–23)
CALCIUM: 10.2 mg/dL (ref 8.4–10.5)
CO2: 30 mEq/L (ref 19–32)
Chloride: 100 mEq/L (ref 96–112)
Creatinine, Ser: 0.8 mg/dL (ref 0.50–1.10)
GFR calc non Af Amer: 85 mL/min — ABNORMAL LOW (ref >90)
Glucose, Bld: 96 mg/dL (ref 70–99)
Potassium: 3.8 mEq/L (ref 3.7–5.3)
Sodium: 142 mEq/L (ref 137–147)

## 2014-07-13 MED ORDER — KETOROLAC TROMETHAMINE 30 MG/ML IJ SOLN
30.0000 mg | Freq: Once | INTRAMUSCULAR | Status: AC
  Administered 2014-07-13: 30 mg via INTRAVENOUS
  Filled 2014-07-13: qty 1

## 2014-07-13 MED ORDER — MORPHINE SULFATE 4 MG/ML IJ SOLN
4.0000 mg | Freq: Once | INTRAMUSCULAR | Status: AC
  Administered 2014-07-13: 4 mg via INTRAVENOUS
  Filled 2014-07-13: qty 1

## 2014-07-13 MED ORDER — HYDROCODONE-ACETAMINOPHEN 5-325 MG PO TABS: 1.0000 | ORAL_TABLET | ORAL | Status: DC | PRN

## 2014-07-13 NOTE — ED Notes (Addendum)
Pt reports R groin pain since this morning after getting out of tub- states has had same pain before but it went away- "pulling pain" worse with movement and walking- states she fell this morning walking back from the bathroom

## 2014-07-13 NOTE — ED Provider Notes (Signed)
CSN: [id number]     Arrival date & time 07/13/14  1010 History   First MD Initiated Contact with Patient 07/13/14 1109     Chief Complaint  Patient presents with  . Abdominal Pain     (Consider location/radiation/quality/duration/timing/severity/associated sxs/prior Treatment) HPI Comments: Patient is a 48 year old female with history of high cholesterol and depression. She presents with complaints of right flank pain that started abruptly while getting out of the bathtub. States she is unable to find a comfortable position, however her pain is worse with walking and movement. She denies any urinary complaints. She denies any bowel complaints. She denies any fevers or chills.  Patient is a 48 y.o. female presenting with abdominal pain. The history is provided by the patient.  Abdominal Pain Pain location:  RLQ Pain quality: cramping   Pain radiates to:  R flank Pain severity:  Severe Onset quality:  Sudden Duration:  2 hours Timing:  Constant Progression:  Unchanged Chronicity:  New   Past Medical History  Diagnosis Date  . Hyperlipemia   . Depression   . High cholesterol   . Thyroid disease    Past Surgical History  Procedure Laterality Date  . Tubal ligation    . Knee surgery     No family history on file. History  Substance Use Topics  . Smoking status: Current Every Day Smoker -- 0.50 packs/day    Types: Cigarettes  . Smokeless tobacco: Never Used  . Alcohol Use: 0.5 oz/week    1 drink(s) per week     Comment: RARE   OB History   Grav Para Term Preterm Abortions TAB SAB Ect Mult Living                 Review of Systems  Gastrointestinal: Positive for abdominal pain.  All other systems reviewed and are negative.     Allergies  Asa; Bactrim; Flagyl; and Sulfa antibiotics  Home Medications   Prior to Admission medications   Medication Sig Start Date End Date Taking? Authorizing Provider  baclofen (LIORESAL) 10 MG tablet Take 10 mg by mouth 3 (three)  times daily.   Yes Historical Provider, MD  cyclobenzaprine (FLEXERIL) 10 MG tablet Take 1 tablet (10 mg total) by mouth 2 (two) times daily as needed for muscle spasms. 01/10/14  Yes Shari A Upstill, PA-C  escitalopram (LEXAPRO) 10 MG tablet Take 10 mg by mouth daily.   Yes Historical Provider, MD  HYDROcodone-acetaminophen (NORCO/VICODIN) 5-325 MG per tablet Take 1-2 tablets by mouth every 4 (four) hours as needed. 01/10/14  Yes Shari A Upstill, PA-C  Pregabalin (LYRICA PO) Take by mouth.   Yes Historical Provider, MD  simvastatin (ZOCOR) 20 MG tablet Take 20 mg by mouth daily.   Yes Historical Provider, MD  clotrimazole (LOTRIMIN) 1 % cream Apply 1 application topically 2 (two) times daily.    Historical Provider, MD  oxyCODONE-acetaminophen (PERCOCET/ROXICET) 5-325 MG per tablet Take 1-2 tablets by mouth every 6 (six) hours as needed for severe pain. 05/02/14   Ethelda ChickMartha K Linker, MD   BP 114/78  Pulse 105  Temp(Src) 99.6 F (37.6 C) (Oral)  Resp 18  SpO2 97%  LMP 07/03/2014 Physical Exam  Nursing note and vitals reviewed. Constitutional: She is oriented to person, place, and time. She appears well-developed and well-nourished. No distress.  HENT:  Head: Normocephalic and atraumatic.  Neck: Normal range of motion. Neck supple.  Cardiovascular: Normal rate and regular rhythm.  Exam reveals no gallop and no friction  rub.   No murmur heard. Pulmonary/Chest: Effort normal and breath sounds normal. No respiratory distress. She has no wheezes.  Abdominal: Soft. Bowel sounds are normal. She exhibits no distension. There is tenderness. There is no rebound and no guarding.  There is tenderness to palpation in the right lower quadrant and suprapubic region. There is no rebound and no guarding. There is mild CVA tenderness.  Musculoskeletal: Normal range of motion.  Neurological: She is alert and oriented to person, place, and time.  Skin: Skin is warm and dry. She is not diaphoretic.    ED Course   Procedures (including critical care time) Labs Review Labs Reviewed  CBC WITH DIFFERENTIAL  BASIC METABOLIC PANEL    Imaging Review No results found.   EKG Interpretation None      MDM   Final diagnoses:  Right flank pain    CT scan reveals an inguinal hernia, however no other significant intra-abdominal pathology. I am unable to palpate this on exam and I doubt this is the cause of her discomfort, and is more likely a incidental finding. The nature of her symptoms appears musculoskeletal in nature and she will be treated as such. I've also advised her to follow up with general surgery regarding her inguinal hernia and return to the ER if she begins vomiting, running a high fever, or if any changes occur.    Geoffery Lyonsouglas Milen Lengacher, MD 07/13/14 1520

## 2014-07-13 NOTE — ED Notes (Signed)
Patient transported to CT 

## 2014-07-13 NOTE — Discharge Instructions (Signed)
Hydrocodone as prescribed as needed for pain.  Followup with [facility] to discuss your hernia.  Return to the emergency department for high fever, worsening pain, or other new and concerning symptoms.   Abdominal Pain, Women Abdominal (stomach, pelvic, or belly) pain can be caused by many things. It is important to tell your doctor:  The location of the pain.  Does it come and go or is it present all the time?  Are there things that start the pain (eating certain foods, exercise)?  Are there other symptoms associated with the pain (fever, nausea, vomiting, diarrhea)? All of this is helpful to know when trying to find the cause of the pain. CAUSES   Stomach: virus or bacteria infection, or ulcer.  Intestine: appendicitis (inflamed appendix), regional ileitis (Crohn's disease), ulcerative colitis (inflamed colon), irritable bowel syndrome, diverticulitis (inflamed diverticulum of the colon), or cancer of the stomach or intestine.  Gallbladder disease or stones in the gallbladder.  Kidney disease, kidney stones, or infection.  Pancreas infection or cancer.  Fibromyalgia (pain disorder).  Diseases of the female organs:  Uterus: fibroid (non-cancerous) tumors or infection.  Fallopian tubes: infection or tubal pregnancy.  Ovary: cysts or tumors.  Pelvic adhesions (scar tissue).  Endometriosis (uterus lining tissue growing in the pelvis and on the pelvic organs).  Pelvic congestion syndrome (female organs filling up with blood just before the menstrual period).  Pain with the menstrual period.  Pain with ovulation (producing an egg).  Pain with an IUD (intrauterine device, birth control) in the uterus.  Cancer of the female organs.  Functional pain (pain not caused by a disease, may improve without treatment).  Psychological pain.  Depression. DIAGNOSIS  Your doctor will decide the seriousness of your pain by doing an examination.  Blood  tests.  X-rays.  Ultrasound.  CT scan (computed tomography, special type of X-ray).  MRI (magnetic resonance imaging).  Cultures, for infection.  Barium enema (dye inserted in the large intestine, to better view it with X-rays).  Colonoscopy (looking in intestine with a lighted tube).  Laparoscopy (minor surgery, looking in abdomen with a lighted tube).  Major abdominal exploratory surgery (looking in abdomen with a large incision). TREATMENT  The treatment will depend on the cause of the pain.   Many cases can be observed and treated at home.  Over-the-counter medicines recommended by your caregiver.  Prescription medicine.  Antibiotics, for infection.  Birth control pills, for painful periods or for ovulation pain.  Hormone treatment, for endometriosis.  Nerve blocking injections.  Physical therapy.  Antidepressants.  Counseling with a psychologist or psychiatrist.  Minor or major surgery. HOME CARE INSTRUCTIONS   Do not take laxatives, unless directed by your caregiver.  Take over-the-counter pain medicine only if ordered by your caregiver. Do not take aspirin because it can cause an upset stomach or bleeding.  Try a clear liquid diet (broth or water) as ordered by your caregiver. Slowly move to a bland diet, as tolerated, if the pain is related to the stomach or intestine.  Have a thermometer and take your temperature several times a day, and record it.  Bed rest and sleep, if it helps the pain.  Avoid sexual intercourse, if it causes pain.  Avoid stressful situations.  Keep your follow-up appointments and tests, as your caregiver orders.  If the pain does not go away with medicine or surgery, you may try:  Acupuncture.  Relaxation exercises (yoga, meditation).  Group therapy.  Counseling. SEEK MEDICAL CARE IF:  You notice certain foods cause stomach pain.  Your home care treatment is not helping your pain.  You need stronger pain  medicine.  You want your IUD removed.  You feel faint or lightheaded.  You develop nausea and vomiting.  You develop a rash.  You are having side effects or an allergy to your medicine. SEEK IMMEDIATE MEDICAL CARE IF:   Your pain does not go away or gets worse.  You have a fever.  Your pain is felt only in portions of the abdomen. The right side could possibly be appendicitis. The left lower portion of the abdomen could be colitis or diverticulitis.  You are passing blood in your stools (bright red or black tarry stools, with or without vomiting).  You have blood in your urine.  You develop chills, with or without a fever.  You pass out. MAKE SURE YOU:   Understand these instructions.  Will watch your condition.  Will get help right away if you are not doing well or get worse. Document Released: 07/06/2007 Document Revised: 01/23/2014 Document Reviewed: 07/26/2009 [facility] Patient Information 2015 [place], [place]. This information is not intended to replace advice given to you by your health care provider. Make sure you discuss any questions you have with your health care provider.

## 2014-07-13 NOTE — ED Notes (Signed)
Patient asked to change into a gown.  

## 2014-10-19 ENCOUNTER — Encounter (HOSPITAL_BASED_OUTPATIENT_CLINIC_OR_DEPARTMENT_OTHER): Payer: Self-pay | Admitting: *Deleted

## 2014-10-19 ENCOUNTER — Emergency Department (HOSPITAL_BASED_OUTPATIENT_CLINIC_OR_DEPARTMENT_OTHER): Payer: Medicaid Other

## 2014-10-19 ENCOUNTER — Emergency Department (HOSPITAL_BASED_OUTPATIENT_CLINIC_OR_DEPARTMENT_OTHER)
Admission: EM | Admit: 2014-10-19 | Discharge: 2014-10-19 | Disposition: A | Discharge status: Home / Self Care | Payer: Medicaid Other | Attending: Emergency Medicine | Admitting: Emergency Medicine

## 2014-10-19 DIAGNOSIS — M25512 Pain in left shoulder: Secondary | ICD-10-CM | POA: Insufficient documentation

## 2014-10-19 DIAGNOSIS — F329 Major depressive disorder, single episode, unspecified: Secondary | ICD-10-CM | POA: Diagnosis not present

## 2014-10-19 DIAGNOSIS — Z720 Tobacco use: Secondary | ICD-10-CM | POA: Diagnosis not present

## 2014-10-19 DIAGNOSIS — Z79899 Other long term (current) drug therapy: Secondary | ICD-10-CM | POA: Diagnosis not present

## 2014-10-19 DIAGNOSIS — G8929 Other chronic pain: Secondary | ICD-10-CM | POA: Insufficient documentation

## 2014-10-19 DIAGNOSIS — E785 Hyperlipidemia, unspecified: Secondary | ICD-10-CM | POA: Insufficient documentation

## 2014-10-19 DIAGNOSIS — W19XXXA Unspecified fall, initial encounter: Secondary | ICD-10-CM

## 2014-10-19 MED ORDER — MORPHINE SULFATE 4 MG/ML IJ SOLN
6.0000 mg | Freq: Once | INTRAMUSCULAR | Status: AC
  Administered 2014-10-19: 6 mg via INTRAMUSCULAR
  Filled 2014-10-19: qty 2

## 2014-10-19 MED ORDER — IBUPROFEN 800 MG PO TABS: 800.0000 mg | ORAL_TABLET | Freq: Three times a day (TID) | ORAL | Status: DC | PRN

## 2014-10-19 MED ORDER — IBUPROFEN 800 MG PO TABS
800.0000 mg | ORAL_TABLET | Freq: Once | ORAL | Status: AC
  Administered 2014-10-19: 800 mg via ORAL
  Filled 2014-10-19: qty 1

## 2014-10-19 NOTE — ED Notes (Signed)
Patient assisted in changing into a gown from waist up.

## 2014-10-19 NOTE — Discharge Instructions (Signed)
Shoulder Pain The shoulder is the joint that connects your arms to your body. The bones that form the shoulder joint include the upper arm bone (humerus), the shoulder blade (scapula), and the collarbone (clavicle). The top of the humerus is shaped like a ball and fits into a rather flat socket on the scapula (glenoid cavity). A combination of muscles and strong, fibrous tissues that connect muscles to bones (tendons) support your shoulder joint and hold the ball in the socket. Small, fluid-filled sacs (bursae) are located in different areas of the joint. They act as cushions between the bones and the overlying soft tissues and help reduce friction between the gliding tendons and the bone as you move your arm. Your shoulder joint allows a wide range of motion in your arm. This range of motion allows you to do things like scratch your back or throw a ball. However, this range of motion also makes your shoulder more prone to pain from overuse and injury. Causes of shoulder pain can originate from both injury and overuse and usually can be grouped in the following four categories:  Redness, swelling, and pain (inflammation) of the tendon (tendinitis) or the bursae (bursitis).  Instability, such as a dislocation of the joint.  Inflammation of the joint (arthritis).  Broken bone (fracture). HOME CARE INSTRUCTIONS   Apply ice to the sore area.  Put ice in a plastic bag.  Place a towel between your skin and the bag.  Leave the ice on for 15-20 minutes, 3-4 times per day for the first 2 days, or as directed by your health care provider.  Stop using cold packs if they do not help with the pain.  If you have a shoulder sling or immobilizer, wear it as long as your caregiver instructs. Only remove it to shower or bathe. Move your arm as little as possible, but keep your hand moving to prevent swelling.  Squeeze a soft ball or foam pad as much as possible to help prevent swelling.  Only take  over-the-counter or prescription medicines for pain, discomfort, or fever as directed by your caregiver. SEEK MEDICAL CARE IF:   Your shoulder pain increases, or new pain develops in your arm, hand, or fingers.  Your hand or fingers become cold and numb.  Your pain is not relieved with medicines. SEEK IMMEDIATE MEDICAL CARE IF:   Your arm, hand, or fingers are numb or tingling.  Your arm, hand, or fingers are significantly swollen or turn white or blue. MAKE SURE YOU:   Understand these instructions.  Will watch your condition.  Will get help right away if you are not doing well or get worse. Document Released: 06/18/2005 Document Revised: 01/23/2014 Document Reviewed: 08/23/2011 [facility] Patient Information 2015 [place], [place]. This information is not intended to replace advice given to you by your health care provider. Make sure you discuss any questions you have with your health care provider.    Possible Rotator Cuff Injury Rotator cuff injury is any type of injury to the set of muscles and tendons that make up the stabilizing unit of your shoulder. This unit holds the ball of your upper arm bone (humerus) in the socket of your shoulder blade (scapula).  CAUSES Injuries to your rotator cuff most commonly come from sports or activities that cause your arm to be moved repeatedly over your head. Examples of this include throwing, weight lifting, swimming, or racquet sports. Long lasting (chronic) irritation of your rotator cuff can cause soreness and swelling (inflammation),  bursitis, and eventual damage to your tendons, such as a tear (rupture). SIGNS AND SYMPTOMS Acute rotator cuff tear:  Sudden tearing sensation followed by severe pain shooting from your upper shoulder down your arm toward your elbow.  Decreased range of motion of your shoulder because of pain and muscle spasm.  Severe pain.  Inability to raise your arm out to the side because of pain and loss of muscle  power (large tears). Chronic rotator cuff tear:  Pain that usually is worse at night and may interfere with sleep.  Gradual weakness and decreased shoulder motion as the pain worsens.  Decreased range of motion. Rotator cuff tendinitis:  Deep ache in your shoulder and the outside upper arm over your shoulder.  Pain that comes on gradually and becomes worse when lifting your arm to the side or turning it inward. DIAGNOSIS Rotator cuff injury is diagnosed through a medical history, physical exam, and imaging exam. The medical history helps determine the type of rotator cuff injury. Your health care provider will look at your injured shoulder, feel the injured area, and ask you to move your shoulder in different positions. X-ray exams typically are done to rule out other causes of shoulder pain, such as fractures. MRI is the exam of choice for the most severe shoulder injuries because the images show muscles and tendons.  TREATMENT  Chronic tear:  Medicine for pain, such as acetaminophen or ibuprofen.  Physical therapy and range-of-motion exercises may be helpful in maintaining shoulder function and strength.  Steroid injections into your shoulder joint.  Surgical repair of the rotator cuff if the injury does not heal with noninvasive treatment. Acute tear:  Anti-inflammatory medicines such as ibuprofen and naproxen to help reduce pain and swelling.  A sling to help support your arm and rest your rotator cuff muscles. Long-term use of a sling is not advised. It may cause significant stiffening of the shoulder joint.  Surgery may be considered within a few weeks, especially in younger, active people, to return the shoulder to full function.  Indications for surgical treatment include the following:  Age younger than 60 years.  Rotator cuff tears that are complete.  Physical therapy, rest, and anti-inflammatory medicines have been used for 6-8 weeks, with no  improvement.  Employment or sporting activity that requires constant shoulder use. Tendinitis:  Anti-inflammatory medicines such as ibuprofen and naproxen to help reduce pain and swelling.  A sling to help support your arm and rest your rotator cuff muscles. Long-term use of a sling is not advised. It may cause significant stiffening of the shoulder joint.  Severe tendinitis may require:  Steroid injections into your shoulder joint.  Physical therapy.  Surgery. HOME CARE INSTRUCTIONS   Apply ice to your injury:  Put ice in a plastic bag.  Place a towel between your skin and the bag.  Leave the ice on for 20 minutes, 2-3 times a day.  If you have a shoulder immobilizer (sling and straps), wear it until told otherwise by your health care provider.  You may want to sleep on several pillows or in a recliner at night to lessen swelling and pain.  Only take over-the-counter or prescription medicines for pain, discomfort, or fever as directed by your health care provider.  Do simple hand squeezing exercises with a soft rubber ball to decrease hand swelling. SEEK MEDICAL CARE IF:   Your shoulder pain increases, or new pain or numbness develops in your arm, hand, or fingers.  Your hand  or fingers are colder than your other hand. SEEK IMMEDIATE MEDICAL CARE IF:   Your arm, hand, or fingers are numb or tingling.  Your arm, hand, or fingers are increasingly swollen and painful, or they turn white or blue. MAKE SURE YOU:  Understand these instructions.  Will watch your condition.  Will get help right away if you are not doing well or get worse. Document Released: 09/05/2000 Document Revised: 09/13/2013 Document Reviewed: 04/20/2013 [facility] Patient Information 2015 [place], [place]. This information is not intended to replace advice given to you by your health care provider. Make sure you discuss any questions you have with your health care provider.

## 2014-10-19 NOTE — ED Notes (Signed)
She is a pt in pain management and take Hydrocodone for chronic back and leg pain.  States she has been taking Hydrocodone every 4 hours over the past week.  Pain is described as severe, nothing makes it better and movement and palpation makes the pain worse.

## 2014-10-19 NOTE — ED Provider Notes (Signed)
TIME SEEN: 12:15 PM  CHIEF COMPLAINT: Left shoulder pain  HPI: Patient is a right-hand-dominant 49 y.o. F with history of hyperlipidemia, hypothyroidism who presents to the emergency room with left shoulder pain. Reports that she fell off her toilet one month ago under her left shoulder and has had pain since that time. Pain is worse with movement. She is on hydrocodone chronically and is on a pain management. States the hydrocodone has not been helping her pain. No new injury. No numbness, tingling or focal weakness. No chest pain or shortness of breath. No fever.  ROS: See HPI Constitutional: no fever  Eyes: no drainage  ENT: no runny nose   Cardiovascular:  no chest pain  Resp: no SOB  GI: no vomiting GU: no dysuria Integumentary: no rash  Allergy: no hives  Musculoskeletal: no leg swelling  Neurological: no slurred speech ROS otherwise negative  PAST MEDICAL HISTORY/PAST SURGICAL HISTORY:  Past Medical History  Diagnosis Date  . Hyperlipemia   . Depression   . High cholesterol   . Thyroid disease     MEDICATIONS:  Prior to Admission medications   Medication Sig Start Date End Date Taking? Authorizing Provider  baclofen (LIORESAL) 10 MG tablet Take 10 mg by mouth 3 (three) times daily.    Historical Provider, MD  clotrimazole (LOTRIMIN) 1 % cream Apply 1 application topically 2 (two) times daily.    Historical Provider, MD  cyclobenzaprine (FLEXERIL) 10 MG tablet Take 1 tablet (10 mg total) by mouth 2 (two) times daily as needed for muscle spasms. 01/10/14   Shari A Upstill, PA-C  escitalopram (LEXAPRO) 10 MG tablet Take 10 mg by mouth daily.    Historical Provider, MD  HYDROcodone-acetaminophen (NORCO) 5-325 MG per tablet Take 1-2 tablets by mouth every 4 (four) hours as needed. 07/13/14   Geoffery Lyonsouglas Delo, MD  HYDROcodone-acetaminophen (NORCO/VICODIN) 5-325 MG per tablet Take 1-2 tablets by mouth every 4 (four) hours as needed. 01/10/14   Shari A Upstill, PA-C   oxyCODONE-acetaminophen (PERCOCET/ROXICET) 5-325 MG per tablet Take 1-2 tablets by mouth every 6 (six) hours as needed for severe pain. 05/02/14   Ethelda ChickMartha K Linker, MD  Pregabalin (LYRICA PO) Take by mouth.    Historical Provider, MD  simvastatin (ZOCOR) 20 MG tablet Take 20 mg by mouth daily.    Historical Provider, MD    ALLERGIES:  Allergies  Allergen Reactions  . Asa [Aspirin]   . Bactrim [Sulfamethoxazole-Trimethoprim]   . Flagyl [Metronidazole]   . Sulfa Antibiotics     SOCIAL HISTORY:  History  Substance Use Topics  . Smoking status: Current Every Day Smoker -- 0.50 packs/day    Types: Cigarettes  . Smokeless tobacco: Never Used  . Alcohol Use: 0.5 oz/week    1 drink(s) per week     Comment: RARE    FAMILY HISTORY: No family history on file.  EXAM: BP 127/83 mmHg  Pulse 102  Temp(Src) 99.5 F (37.5 C) (Oral)  Resp 18  Ht 5\' 2"  (1.575 m)  Wt 180 lb (81.647 kg)  BMI 32.91 kg/m2  SpO2 100% CONSTITUTIONAL: Alert and oriented and responds appropriately to questions. Well-appearing; well-nourished HEAD: Normocephalic EYES: Conjunctivae clear, PERRL ENT: normal nose; no rhinorrhea; moist mucous membranes; pharynx without lesions noted NECK: Supple, no meningismus, no LAD  CARD: RRR; S1 and S2 appreciated; no murmurs, no clicks, no rubs, no gallops RESP: Normal chest excursion without splinting or tachypnea; breath sounds clear and equal bilaterally; no wheezes, no rhonchi, no rales,  ABD/GI: Normal bowel sounds; non-distended; soft, non-tender, no rebound, no guarding BACK:  The back appears normal and is non-tender to palpation, there is no CVA tenderness EXT: Tender to palpation diffusely over the left shoulder without obvious bony deformity, no joint effusion, no erythema or warmth, decreased range of motion in the shoulder secondary to pain, no tenderness over the elbow, forearm, wrist or hand on the left side, normal grip strength bilaterally, 2+ radial pulses  bilaterally, compartments are soft, sensation to light touch intact diffusely, otherwise Normal ROM in all joints; there is externally is are non-tender to palpation; no edema; normal capillary refill; no cyanosis    SKIN: Normal color for age and race; warm NEURO: Moves all extremities equally PSYCH: The patient's mood and manner are appropriate. Grooming and personal hygiene are appropriate.  MEDICAL DECISION MAKING: Patient here with likely rotator cuff tendinitis. No sign of septic arthritis on exam. Will give dose of IV and morphine and ibuprofen in the emergency department.  Given she is on a pain contract have discussed with patient that I cannot send her home with more narcotics and she verbalized understanding. Discussed with her that she should discuss this with her pain management specialist. We'll discharge with prescription for ibuprofen and orthopedic follow-up information. Discussed return precautions. Also discussed with patient that I do not want to put her in a sling as I'm concerned this may cause her to not move her shoulder at all causing adhesive capsulitis. She verbalized understanding. X-rays today show no acute injury.   Layla Maw Yaiden Yang, DO 10/19/14 1302

## 2014-10-19 NOTE — ED Notes (Signed)
Pt slipped and fell off of her toilet 1 month ago. She was initially pain free but about 1 week ago her left shoulder began hurting with the pain radiating down her left arm. Pt sts arm is swollen and warm to the touch.

## 2014-12-06 ENCOUNTER — Encounter (HOSPITAL_BASED_OUTPATIENT_CLINIC_OR_DEPARTMENT_OTHER): Payer: Self-pay | Admitting: *Deleted

## 2014-12-06 ENCOUNTER — Emergency Department (HOSPITAL_BASED_OUTPATIENT_CLINIC_OR_DEPARTMENT_OTHER): Payer: Medicaid Other

## 2014-12-06 ENCOUNTER — Emergency Department (HOSPITAL_BASED_OUTPATIENT_CLINIC_OR_DEPARTMENT_OTHER)
Admission: EM | Admit: 2014-12-06 | Discharge: 2014-12-06 | Disposition: A | Discharge status: Other Acute Inpatient Hospital | Payer: Medicaid Other | Attending: Emergency Medicine | Admitting: Emergency Medicine

## 2014-12-06 DIAGNOSIS — R079 Chest pain, unspecified: Secondary | ICD-10-CM | POA: Diagnosis present

## 2014-12-06 DIAGNOSIS — F329 Major depressive disorder, single episode, unspecified: Secondary | ICD-10-CM | POA: Diagnosis not present

## 2014-12-06 DIAGNOSIS — E785 Hyperlipidemia, unspecified: Secondary | ICD-10-CM | POA: Diagnosis not present

## 2014-12-06 DIAGNOSIS — Z720 Tobacco use: Secondary | ICD-10-CM | POA: Insufficient documentation

## 2014-12-06 DIAGNOSIS — Z79899 Other long term (current) drug therapy: Secondary | ICD-10-CM | POA: Diagnosis not present

## 2014-12-06 DIAGNOSIS — Z8739 Personal history of other diseases of the musculoskeletal system and connective tissue: Secondary | ICD-10-CM | POA: Insufficient documentation

## 2014-12-06 DIAGNOSIS — E780 Pure hypercholesterolemia: Secondary | ICD-10-CM | POA: Diagnosis not present

## 2014-12-06 DIAGNOSIS — J449 Chronic obstructive pulmonary disease, unspecified: Secondary | ICD-10-CM | POA: Diagnosis not present

## 2014-12-06 HISTORY — DX: Other intervertebral disc degeneration, lumbar region without mention of lumbar back pain or lower extremity pain: M51.369

## 2014-12-06 HISTORY — DX: Other intervertebral disc degeneration, lumbar region: M51.36

## 2014-12-06 HISTORY — DX: Other intervertebral disc displacement, lumbar region: M51.26

## 2014-12-06 HISTORY — DX: Chronic obstructive pulmonary disease, unspecified: J44.9

## 2014-12-06 HISTORY — DX: Sciatica, unspecified side: M54.30

## 2014-12-06 LAB — CBC WITH DIFFERENTIAL/PLATELET
BASOS ABS: 0 10*3/uL (ref 0.0–0.1)
BASOS PCT: 0 % (ref 0–1)
EOS PCT: 1 % (ref 0–5)
Eosinophils Absolute: 0.1 10*3/uL (ref 0.0–0.7)
HCT: 40.1 % (ref 36.0–46.0)
Hemoglobin: 13.4 g/dL (ref 12.0–15.0)
Lymphocytes Relative: 35 % (ref 12–46)
Lymphs Abs: 3.2 10*3/uL (ref 0.7–4.0)
MCH: 28.8 pg (ref 26.0–34.0)
MCHC: 33.4 g/dL (ref 30.0–36.0)
MCV: 86.1 fL (ref 78.0–100.0)
Monocytes Absolute: 0.7 10*3/uL (ref 0.1–1.0)
Monocytes Relative: 8 % (ref 3–12)
NEUTROS ABS: 5 10*3/uL (ref 1.7–7.7)
Neutrophils Relative %: 56 % (ref 43–77)
Platelets: 309 10*3/uL (ref 150–400)
RBC: 4.66 MIL/uL (ref 3.87–5.11)
RDW: 15.7 % — AB (ref 11.5–15.5)
WBC: 9 10*3/uL (ref 4.0–10.5)

## 2014-12-06 LAB — BASIC METABOLIC PANEL
Anion gap: 9 (ref 5–15)
BUN: 9 mg/dL (ref 6–23)
CO2: 26 mmol/L (ref 19–32)
CREATININE: 0.72 mg/dL (ref 0.50–1.10)
Calcium: 9.5 mg/dL (ref 8.4–10.5)
Chloride: 102 mmol/L (ref 96–112)
GFR calc non Af Amer: >90 mL/min (ref >90)
GLUCOSE: 191 mg/dL — AB (ref 70–99)
POTASSIUM: 3.2 mmol/L — AB (ref 3.5–5.1)
Sodium: 137 mmol/L (ref 135–145)

## 2014-12-06 LAB — BRAIN NATRIURETIC PEPTIDE: B NATRIURETIC PEPTIDE 5: 16.9 pg/mL (ref 0.0–100.0)

## 2014-12-06 LAB — TROPONIN I: Troponin I: <0.03 ng/mL (ref <0.031)

## 2014-12-06 LAB — D-DIMER, QUANTITATIVE: D-Dimer, Quant: 0.35 ug/mL-FEU (ref 0.00–0.48)

## 2014-12-06 MED ORDER — ASPIRIN 81 MG PO CHEW
324.0000 mg | CHEWABLE_TABLET | Freq: Once | ORAL | Status: AC
  Administered 2014-12-06: 324 mg via ORAL
  Filled 2014-12-06: qty 4

## 2014-12-06 NOTE — ED Provider Notes (Signed)
TIME SEEN: 11:40 AM  CHIEF COMPLAINT: Chest pain, shortness of breath  HPI: Pt is a 49 y.o. female with history of hypertension, hyperlipidemia, COPD, hypothyroidism, prediabetes, tobacco use presents to the emergency department 3 days of intermittent left-sided chest tightness that radiates into her left upper arm associated shortness of breath, nausea and diaphoresis. Pain is worse with exertion and better with rest. Currently chest pain-free. Last stress test was in July 2015 at [facility]. States she does have family history of a grandmother who had a heart attack in her 27s. Her PCP is Dr. Venetia Night.  She does not have a cardiologist. Denies history of PE or DVT. Has noticed several weeks of right lower extremity swelling and pain.  ROS: See HPI Constitutional: no fever  Eyes: no drainage  ENT: no runny nose   Cardiovascular:   chest pain  Resp:  SOB  GI: no vomiting GU: no dysuria Integumentary: no rash  Allergy: no hives  Musculoskeletal: no leg swelling  Neurological: no slurred speech ROS otherwise negative  PAST MEDICAL HISTORY/PAST SURGICAL HISTORY:  Past Medical History  Diagnosis Date  . Hyperlipemia   . Depression   . High cholesterol   . Thyroid disease   . COPD (chronic obstructive pulmonary disease)   . Sciatica   . Bulging lumbar disc     MEDICATIONS:  Prior to Admission medications   Medication Sig Start Date End Date Taking? Authorizing Provider  baclofen (LIORESAL) 10 MG tablet Take 10 mg by mouth 3 (three) times daily.    Historical Provider, MD  clotrimazole (LOTRIMIN) 1 % cream Apply 1 application topically 2 (two) times daily.    Historical Provider, MD  cyclobenzaprine (FLEXERIL) 10 MG tablet Take 1 tablet (10 mg total) by mouth 2 (two) times daily as needed for muscle spasms. 01/10/14   Elpidio Anis, PA-C  escitalopram (LEXAPRO) 10 MG tablet Take 10 mg by mouth daily.    Historical Provider, MD  HYDROcodone-acetaminophen (NORCO) 5-325 MG  per tablet Take 1-2 tablets by mouth every 4 (four) hours as needed. 07/13/14   Geoffery Lyons, MD  HYDROcodone-acetaminophen (NORCO/VICODIN) 5-325 MG per tablet Take 1-2 tablets by mouth every 4 (four) hours as needed. 01/10/14   Elpidio Anis, PA-C  ibuprofen (ADVIL,MOTRIN) 800 MG tablet Take 1 tablet (800 mg total) by mouth every 8 (eight) hours as needed for mild pain. 10/19/14   Erikah Thumm N Kiyaan Haq, DO  oxyCODONE-acetaminophen (PERCOCET/ROXICET) 5-325 MG per tablet Take 1-2 tablets by mouth every 6 (six) hours as needed for severe pain. 05/02/14   Jerelyn Scott, MD  Pregabalin (LYRICA PO) Take by mouth.    Historical Provider, MD  simvastatin (ZOCOR) 20 MG tablet Take 20 mg by mouth daily.    Historical Provider, MD    ALLERGIES:  Allergies  Allergen Reactions  . Asa [Aspirin]   . Bactrim [Sulfamethoxazole-Trimethoprim]   . Flagyl [Metronidazole]   . Sulfa Antibiotics     SOCIAL HISTORY:  History  Substance Use Topics  . Smoking status: Current Every Day Smoker -- 0.50 packs/day    Types: Cigarettes  . Smokeless tobacco: Never Used  . Alcohol Use: No     Comment: RARE    FAMILY HISTORY: No family history on file.  EXAM: BP 119/72 mmHg  Pulse 94  Temp(Src) 98.3 F (36.8 C) (Oral)  Resp 18  Ht  (1.6 m)  Wt 186 lb (84.369 kg)  BMI 32.96 kg/m2  SpO2 100%  LMP 11/11/2014 CONSTITUTIONAL: Alert and  oriented and responds appropriately to questions. Well-appearing; well-nourished HEAD: Normocephalic EYES: Conjunctivae clear, PERRL ENT: normal nose; no rhinorrhea; moist mucous membranes; pharynx without lesions noted NECK: Supple, no meningismus, no LAD  CARD: RRR; S1 and S2 appreciated; no murmurs, no clicks, no rubs, no gallops RESP: Normal chest excursion without splinting or tachypnea; breath sounds clear and equal bilaterally; no wheezes, no rhonchi, no rales, chest wall nontender to palpation, no hypoxia or respiratory distress ABD/GI: Normal bowel sounds; non-distended;  soft, non-tender, no rebound, no guarding BACK:  The back appears normal and is non-tender to palpation, there is no CVA tenderness EXT: Normal ROM in all joints; no edema; normal capillary refill; no cyanosis, some swelling of the right ankle with some tenderness but no erythema or warmth or joint effusion, nonpitting edema; otherwise x-rays are nontender to palpation  SKIN: Normal color for age and race; warm NEURO: Moves all extremities equally PSYCH: The patient's mood and manner are appropriate. Grooming and personal hygiene are appropriate.  MEDICAL DECISION MAKING: Patient here with multiple risk factors for ACS with concerning presentation. EKG shows no ischemic changes, arrhythmia or interval changes. Hemodynamically stable. Currently chest pain-free. We'll give aspirin and obtain cardiac labs. Also complaining of right lower extremity swelling. No prior history of PE or DVT but will obtain d-dimer. Will obtain chest x-ray. Anticipate admission for ACS rule out.  ED PROGRESS: Patient's labs are unremarkable. Troponin negative. BNP 16.9. D-dimer negative. Chest x-ray clear. Still chest pain-free. [facility].   1:13 PM  D/w Dr. Renee RivalNsiah [facility] for admission to telemetry, observation.     EKG Interpretation  Date/Time:  Wednesday December 06 2014 11:20:02 EDT Ventricular Rate:  96 PR Interval:  148 QRS Duration: 80 QT Interval:  338 QTC Calculation: 427 R Axis:   52 Text Interpretation:  Normal sinus rhythm Normal ECG No significant change since last tracing Confirmed by Nyemah Watton,  DO, Brandn Mcgath ([id number]) on 12/06/2014 11:24:58 AM        Layla MawKristen N Asna Muldrow, DO 12/06/14 1313

## 2014-12-06 NOTE — ED Notes (Signed)
Pt c/o med to left CP x3 days with sob, nausea and sweats. Pt also sts her feet/legs are swelling.

## 2014-12-06 NOTE — ED Notes (Signed)
Updated on plan of care- [facility] Family at bedside brought pt a meal

## 2014-12-06 NOTE — ED Notes (Signed)
HP1 at bedside

## 2014-12-06 NOTE — ED Notes (Signed)
MD at bedside. 

## 2014-12-21 ENCOUNTER — Emergency Department (HOSPITAL_BASED_OUTPATIENT_CLINIC_OR_DEPARTMENT_OTHER): Payer: Medicaid Other

## 2014-12-21 ENCOUNTER — Emergency Department (HOSPITAL_BASED_OUTPATIENT_CLINIC_OR_DEPARTMENT_OTHER)
Admission: EM | Admit: 2014-12-21 | Discharge: 2014-12-21 | Disposition: A | Discharge status: Home / Self Care | Payer: Medicaid Other | Attending: Emergency Medicine | Admitting: Emergency Medicine

## 2014-12-21 ENCOUNTER — Encounter (HOSPITAL_BASED_OUTPATIENT_CLINIC_OR_DEPARTMENT_OTHER): Payer: Self-pay

## 2014-12-21 DIAGNOSIS — J449 Chronic obstructive pulmonary disease, unspecified: Secondary | ICD-10-CM | POA: Diagnosis not present

## 2014-12-21 DIAGNOSIS — R079 Chest pain, unspecified: Secondary | ICD-10-CM | POA: Diagnosis not present

## 2014-12-21 DIAGNOSIS — Z79899 Other long term (current) drug therapy: Secondary | ICD-10-CM | POA: Insufficient documentation

## 2014-12-21 DIAGNOSIS — Z8739 Personal history of other diseases of the musculoskeletal system and connective tissue: Secondary | ICD-10-CM | POA: Diagnosis not present

## 2014-12-21 DIAGNOSIS — Z720 Tobacco use: Secondary | ICD-10-CM | POA: Insufficient documentation

## 2014-12-21 DIAGNOSIS — E785 Hyperlipidemia, unspecified: Secondary | ICD-10-CM | POA: Insufficient documentation

## 2014-12-21 DIAGNOSIS — R002 Palpitations: Secondary | ICD-10-CM | POA: Diagnosis not present

## 2014-12-21 DIAGNOSIS — R008 Other abnormalities of heart beat: Secondary | ICD-10-CM | POA: Diagnosis present

## 2014-12-21 DIAGNOSIS — F329 Major depressive disorder, single episode, unspecified: Secondary | ICD-10-CM | POA: Diagnosis not present

## 2014-12-21 LAB — COMPREHENSIVE METABOLIC PANEL
ALK PHOS: 99 U/L (ref 39–117)
ALT: 33 U/L (ref 0–35)
ANION GAP: 7 (ref 5–15)
AST: 50 U/L — ABNORMAL HIGH (ref 0–37)
Albumin: 4.1 g/dL (ref 3.5–5.2)
BUN: 8 mg/dL (ref 6–23)
CHLORIDE: 103 mmol/L (ref 96–112)
CO2: 28 mmol/L (ref 19–32)
CREATININE: 0.75 mg/dL (ref 0.50–1.10)
Calcium: 9.4 mg/dL (ref 8.4–10.5)
GFR calc non Af Amer: >90 mL/min (ref >90)
Glucose, Bld: 137 mg/dL — ABNORMAL HIGH (ref 70–99)
Potassium: 3.5 mmol/L (ref 3.5–5.1)
Sodium: 138 mmol/L (ref 135–145)
Total Bilirubin: 0.2 mg/dL — ABNORMAL LOW (ref 0.3–1.2)
Total Protein: 7.9 g/dL (ref 6.0–8.3)

## 2014-12-21 LAB — CBC
HEMATOCRIT: 39.8 % (ref 36.0–46.0)
Hemoglobin: 13 g/dL (ref 12.0–15.0)
MCH: 28.1 pg (ref 26.0–34.0)
MCHC: 32.7 g/dL (ref 30.0–36.0)
MCV: 86.1 fL (ref 78.0–100.0)
PLATELETS: 335 10*3/uL (ref 150–400)
RBC: 4.62 MIL/uL (ref 3.87–5.11)
RDW: 15.6 % — AB (ref 11.5–15.5)
WBC: 10.7 10*3/uL — ABNORMAL HIGH (ref 4.0–10.5)

## 2014-12-21 LAB — TROPONIN I

## 2014-12-21 MED ORDER — KETOROLAC TROMETHAMINE 30 MG/ML IJ SOLN
30.0000 mg | Freq: Once | INTRAMUSCULAR | Status: AC
  Administered 2014-12-21: 30 mg via INTRAVENOUS
  Filled 2014-12-21: qty 1

## 2014-12-21 NOTE — ED Notes (Signed)
Pt returned from X Ray.

## 2014-12-21 NOTE — Discharge Instructions (Signed)
Return to the ED with any concerns including difficulty breathing, fainting, worsening pain, decreased level of alertness/lethargy, or any other alarming symptoms 

## 2014-12-21 NOTE — ED Notes (Signed)
Patient transported to X-ray 

## 2014-12-21 NOTE — ED Provider Notes (Signed)
CSN: [id number]     Arrival date & time 12/21/14  1041 History   First MD Initiated Contact with Patient 12/21/14 1109     Chief Complaint  Patient presents with  . Irregular Heart Beat     (Consider location/radiation/quality/duration/timing/severity/associated sxs/prior Treatment) HPI  Pt presenting with c/o chest pain and heart racing.  She states that chest pain began last night and has been constant.  It is described as sharp.  No radiation, no nausea or diaphoresis associated.  Pt is not associated with exertion.  She states last night when the pain began she felt her heart racing.  She does have right lower extremity swelling that is chronic in nature and unchanged.  [facility] and states that as far as she knows the workup was negative.  No fever/chills.  No cough.  There are no other associated systemic symptoms, there are no other alleviating or modifying factors.   Past Medical History  Diagnosis Date  . Hyperlipemia   . Depression   . High cholesterol   . Thyroid disease   . COPD (chronic obstructive pulmonary disease)   . Sciatica   . Bulging lumbar disc    Past Surgical History  Procedure Laterality Date  . Tubal ligation    . Knee surgery    . Hernia repair     No family history on file. History  Substance Use Topics  . Smoking status: Current Every Day Smoker -- 0.50 packs/day    Types: Cigarettes  . Smokeless tobacco: Never Used  . Alcohol Use: No     Comment: RARE   OB History    No data available     Review of Systems  ROS reviewed and all otherwise negative except for mentioned in HPI    Allergies  Asa; Bactrim; Flagyl; and Sulfa antibiotics  Home Medications   Prior to Admission medications   Medication Sig Start Date End Date Taking? Authorizing Provider  albuterol (PROVENTIL HFA;VENTOLIN HFA) 108 (90 BASE) MCG/ACT inhaler Inhale 2 puffs into the lungs every 6 (six) hours as needed for wheezing  or shortness of breath.    Historical Provider, MD  baclofen (LIORESAL) 10 MG tablet Take 10 mg by mouth 3 (three) times daily.    Historical Provider, MD  clotrimazole (LOTRIMIN) 1 % cream Apply 1 application topically 2 (two) times daily.    Historical Provider, MD  cyclobenzaprine (FLEXERIL) 10 MG tablet Take 1 tablet (10 mg total) by mouth 2 (two) times daily as needed for muscle spasms. 01/10/14   Elpidio AnisShari Upstill, PA-C  DULoxetine (CYMBALTA) 20 MG capsule Take 20 mg by mouth daily.    Historical Provider, MD  DULoxetine (CYMBALTA) 30 MG capsule Take 30 mg by mouth daily.    Historical Provider, MD  escitalopram (LEXAPRO) 10 MG tablet Take 10 mg by mouth daily.    Historical Provider, MD  esomeprazole (NEXIUM) 40 MG capsule Take 40 mg by mouth daily at 12 noon.    Historical Provider, MD  fluticasone-salmeterol (ADVAIR HFA) 115-21 MCG/ACT inhaler Inhale 2 puffs into the lungs 2 (two) times daily.    Historical Provider, MD  HYDROcodone-acetaminophen (NORCO) 5-325 MG per tablet Take 1-2 tablets by mouth every 4 (four) hours as needed. 07/13/14   Geoffery Lyonsouglas Delo, MD  HYDROcodone-acetaminophen (NORCO/VICODIN) 5-325 MG per tablet Take 1-2 tablets by mouth every 4 (four) hours as needed. 01/10/14   Elpidio AnisShari Upstill, PA-C  ibuprofen (ADVIL,MOTRIN) 800 MG tablet Take  1 tablet (800 mg total) by mouth every 8 (eight) hours as needed for mild pain. 10/19/14   Kristen N Ward, DO  memantine (NAMENDA) 5 MG tablet Take 5 mg by mouth 2 (two) times daily.    Historical Provider, MD  metoprolol (LOPRESSOR) 50 MG tablet Take 50 mg by mouth 2 (two) times daily.    Historical Provider, MD  oxyCODONE-acetaminophen (PERCOCET/ROXICET) 5-325 MG per tablet Take 1-2 tablets by mouth every 6 (six) hours as needed for severe pain. 05/02/14   Jerelyn Scott, MD  Pregabalin (LYRICA PO) Take by mouth.    Historical Provider, MD  promethazine (PHENERGAN) 25 MG tablet Take 25 mg by mouth every 6 (six) hours as needed for nausea or vomiting.     Historical Provider, MD  simvastatin (ZOCOR) 20 MG tablet Take 20 mg by mouth daily.    Historical Provider, MD   BP 124/73 mmHg  Pulse 80  Temp(Src) 98.1 F (36.7 C) (Oral)  Resp 18  Ht  (1.575 m)  Wt 186 lb (84.369 kg)  BMI 34.01 kg/m2  SpO2 98%  LMP 12/09/2014  Vitals reviewed Physical Exam  Physical Examination: General appearance - alert, well appearing, and in no distress Mental status - alert, oriented to person, place, and time Eyes - no conjunctival injection no scleral icterus Mouth - mucous membranes moist, pharynx normal without lesions Chest - clear to auscultation, no wheezes, rales or rhonchi, symmetric air entry Heart - normal rate, regular rhythm, normal S1, S2, no murmurs, rubs, clicks or gallops Abdomen - soft, nontender, nondistended, no masses or organomegaly Extremities - peripheral pulses normal, left lower extremity with 1+, right lower extremity with 2+ edema, no clubbing or cyanosis Skin - normal coloration and turgor, no rashes  ED Course  Procedures (including critical care time) Labs Review Labs Reviewed  CBC - Abnormal; Notable for the following:    WBC 10.7 (*)    RDW 15.6 (*)    All other components within normal limits  COMPREHENSIVE METABOLIC PANEL - Abnormal; Notable for the following:    Glucose, Bld 137 (*)    AST 50 (*)    Total Bilirubin 0.2 (*)    All other components within normal limits  TROPONIN I  TROPONIN I    Imaging Review Dg Chest 2 View  12/21/2014   CLINICAL DATA:  Chest pain, chest tightness and shortness of breath. Irregular heartbeat. Smoker. Initial encounter.  EXAM: CHEST  2 VIEW  COMPARISON:  Chest radiograph 12/06/2014.  CT chest 12/07/2014.  FINDINGS: Patchy airspace disease RIGHT upper lobe is suspected, similar to prior CT. Lungs otherwise clear. Normal heart size. Normal mediastinal contour. No effusion or pneumothorax. Normal osseous structures.  IMPRESSION: Patchy airspace disease RIGHT upper lobe is  suspected. Early pneumonia not excluded.   Electronically Signed   By: Davonna Belling M.D.   On: 12/21/2014 13:07     EKG Interpretation   Date/Time:  Thursday December 21 2014 11:03:07 EDT Ventricular Rate:  82 PR Interval:  152 QRS Duration: 78 QT Interval:  356 QTC Calculation: 415 R Axis:   39 Text Interpretation:  Normal sinus rhythm Normal ECG No significant change  since last tracing Confirmed by [facility]  MD, Shaquille Janes [phone]) on 12/21/2014  11:13:42 AM      MDM   Final diagnoses:  Chest pain, unspecified chest pain type  Palpitations     Per prior records from [facility]- pt had negative stress test one year ago- during recent  hospitalization she ruled out for ACS with similar symptoms.  Had negative DVT study of right lower extremity and negative CT scan of chest - negative for PE.    Today she presents with same recurrence of symptoms.  2 serial troponins are both negative.  Chest pain began > 12 hours ago.  Discharged with strict return precautions.  Pt agreeable with plan.  Jerelyn Scott, MD 12/22/14 [phone]

## 2014-12-21 NOTE — ED Notes (Signed)
Reports "irregular heartbeat" and chest pain since yesterday.  Admitted last week for chest pain and discharged home.

## 2015-04-18 ENCOUNTER — Emergency Department (HOSPITAL_BASED_OUTPATIENT_CLINIC_OR_DEPARTMENT_OTHER): Payer: Medicaid Other

## 2015-04-18 ENCOUNTER — Encounter (HOSPITAL_BASED_OUTPATIENT_CLINIC_OR_DEPARTMENT_OTHER): Payer: Self-pay

## 2015-04-18 ENCOUNTER — Emergency Department (HOSPITAL_BASED_OUTPATIENT_CLINIC_OR_DEPARTMENT_OTHER)
Admission: EM | Admit: 2015-04-18 | Discharge: 2015-04-18 | Disposition: A | Discharge status: Home / Self Care | Payer: Medicaid Other | Attending: Emergency Medicine | Admitting: Emergency Medicine

## 2015-04-18 DIAGNOSIS — K760 Fatty (change of) liver, not elsewhere classified: Secondary | ICD-10-CM | POA: Insufficient documentation

## 2015-04-18 DIAGNOSIS — J449 Chronic obstructive pulmonary disease, unspecified: Secondary | ICD-10-CM | POA: Insufficient documentation

## 2015-04-18 DIAGNOSIS — R110 Nausea: Secondary | ICD-10-CM

## 2015-04-18 DIAGNOSIS — Z3202 Encounter for pregnancy test, result negative: Secondary | ICD-10-CM | POA: Insufficient documentation

## 2015-04-18 DIAGNOSIS — R1011 Right upper quadrant pain: Secondary | ICD-10-CM

## 2015-04-18 DIAGNOSIS — E785 Hyperlipidemia, unspecified: Secondary | ICD-10-CM | POA: Insufficient documentation

## 2015-04-18 DIAGNOSIS — E669 Obesity, unspecified: Secondary | ICD-10-CM | POA: Insufficient documentation

## 2015-04-18 DIAGNOSIS — Z720 Tobacco use: Secondary | ICD-10-CM | POA: Diagnosis not present

## 2015-04-18 DIAGNOSIS — Z8739 Personal history of other diseases of the musculoskeletal system and connective tissue: Secondary | ICD-10-CM | POA: Diagnosis not present

## 2015-04-18 DIAGNOSIS — E780 Pure hypercholesterolemia: Secondary | ICD-10-CM | POA: Diagnosis not present

## 2015-04-18 DIAGNOSIS — Z79899 Other long term (current) drug therapy: Secondary | ICD-10-CM | POA: Diagnosis not present

## 2015-04-18 DIAGNOSIS — R1013 Epigastric pain: Secondary | ICD-10-CM

## 2015-04-18 DIAGNOSIS — F329 Major depressive disorder, single episode, unspecified: Secondary | ICD-10-CM | POA: Insufficient documentation

## 2015-04-18 HISTORY — DX: Calculus of gallbladder without cholecystitis without obstruction: K80.20

## 2015-04-18 LAB — URINALYSIS, ROUTINE W REFLEX MICROSCOPIC
Bilirubin Urine: NEGATIVE
Glucose, UA: NEGATIVE mg/dL
KETONES UR: NEGATIVE mg/dL
NITRITE: NEGATIVE
PROTEIN: NEGATIVE mg/dL
Specific Gravity, Urine: 1.009 (ref 1.005–1.030)
UROBILINOGEN UA: 0.2 mg/dL (ref 0.0–1.0)
pH: 6 (ref 5.0–8.0)

## 2015-04-18 LAB — CBC
HCT: 38.1 % (ref 36.0–46.0)
Hemoglobin: 12.7 g/dL (ref 12.0–15.0)
MCH: 27.7 pg (ref 26.0–34.0)
MCHC: 33.3 g/dL (ref 30.0–36.0)
MCV: 83.2 fL (ref 78.0–100.0)
PLATELETS: 296 10*3/uL (ref 150–400)
RBC: 4.58 MIL/uL (ref 3.87–5.11)
RDW: 17.6 % — ABNORMAL HIGH (ref 11.5–15.5)
WBC: 11.1 10*3/uL — AB (ref 4.0–10.5)

## 2015-04-18 LAB — COMPREHENSIVE METABOLIC PANEL
ALT: 26 U/L (ref 14–54)
ANION GAP: 3 — AB (ref 5–15)
AST: 33 U/L (ref 15–41)
Albumin: 3.8 g/dL (ref 3.5–5.0)
Alkaline Phosphatase: 83 U/L (ref 38–126)
BILIRUBIN TOTAL: 0.6 mg/dL (ref 0.3–1.2)
BUN: 8 mg/dL (ref 6–20)
CALCIUM: 9.4 mg/dL (ref 8.9–10.3)
CHLORIDE: 110 mmol/L (ref 101–111)
CO2: 26 mmol/L (ref 22–32)
Creatinine, Ser: 0.81 mg/dL (ref 0.44–1.00)
GFR calc non Af Amer: >60 mL/min (ref >60)
Glucose, Bld: 92 mg/dL (ref 65–99)
POTASSIUM: 3.4 mmol/L — AB (ref 3.5–5.1)
Sodium: 139 mmol/L (ref 135–145)
Total Protein: 7.2 g/dL (ref 6.5–8.1)

## 2015-04-18 LAB — URINE MICROSCOPIC-ADD ON

## 2015-04-18 LAB — PREGNANCY, URINE: Preg Test, Ur: NEGATIVE

## 2015-04-18 LAB — LIPASE, BLOOD: Lipase: 31 U/L (ref 22–51)

## 2015-04-18 MED ORDER — ONDANSETRON HCL 4 MG/2ML IJ SOLN
4.0000 mg | Freq: Once | INTRAMUSCULAR | Status: AC
  Administered 2015-04-18: 4 mg via INTRAVENOUS
  Filled 2015-04-18: qty 2

## 2015-04-18 MED ORDER — IOHEXOL 300 MG/ML  SOLN
25.0000 mL | Freq: Once | INTRAMUSCULAR | Status: AC | PRN
Start: 2015-04-18 — End: 2015-04-18
  Administered 2015-04-18: 25 mL via ORAL

## 2015-04-18 MED ORDER — IOHEXOL 300 MG/ML  SOLN
100.0000 mL | Freq: Once | INTRAMUSCULAR | Status: AC | PRN
  Administered 2015-04-18: 100 mL via INTRAVENOUS

## 2015-04-18 MED ORDER — SODIUM CHLORIDE 0.9 % IV BOLUS (SEPSIS)
500.0000 mL | Freq: Once | INTRAVENOUS | Status: AC
  Administered 2015-04-18: 500 mL via INTRAVENOUS

## 2015-04-18 MED ORDER — HYDROCODONE-ACETAMINOPHEN 5-325 MG PO TABS: 1.0000 | ORAL_TABLET | ORAL | Status: DC | PRN

## 2015-04-18 MED ORDER — MORPHINE SULFATE 4 MG/ML IJ SOLN
4.0000 mg | Freq: Once | INTRAMUSCULAR | Status: AC
  Administered 2015-04-18: 4 mg via INTRAVENOUS
  Filled 2015-04-18: qty 1

## 2015-04-18 MED ORDER — HYDROMORPHONE HCL 1 MG/ML IJ SOLN
1.0000 mg | Freq: Once | INTRAMUSCULAR | Status: AC
  Administered 2015-04-18: 1 mg via INTRAVENOUS

## 2015-04-18 MED ORDER — HYDROMORPHONE HCL 1 MG/ML IJ SOLN
INTRAMUSCULAR | Status: AC
  Administered 2015-04-18: 1 mg via INTRAVENOUS
  Filled 2015-04-18: qty 1

## 2015-04-18 NOTE — ED Notes (Signed)
PA at bedside.

## 2015-04-18 NOTE — ED Notes (Signed)
Patient transported to CT 

## 2015-04-18 NOTE — ED Provider Notes (Signed)
CSN: [id number]     Arrival date & time 04/18/15  1652 History   First MD Initiated Contact with Patient 04/18/15 1703     Chief Complaint  Patient presents with  . Abdominal Pain     (Consider location/radiation/quality/duration/timing/severity/associated sxs/prior Treatment) HPI Comments: 49 y/o F c/o right-sided abdominal pain 1-2 months, worsening over the past 2 days. Pain starts in her right upper quadrant and radiates down the right side into her groin, described as sharp, cramping and pressure rated 10/10, worse when laying flat or after eating. Pain unrelieved by ibuprofen. Admits to associated nausea, especially after eating. No vomiting. Had an ultrasound one month ago that was ordered by her PCP Dr. Maisie Fus confirming gallstones and "fluid" in her gallbladder. Pain in her groin feels similar to when she had hernia repaired 7 months ago. Denies any bulge. LMP 04/14/2015, ending today. Denies any urinary symptoms or vaginal discharge. Denies fever, chills, chest pain or shortness of breath.  Patient is a 49 y.o. female presenting with abdominal pain. The history is provided by the patient.  Abdominal Pain Associated symptoms: nausea     Past Medical History  Diagnosis Date  . Hyperlipemia   . Depression   . High cholesterol   . Thyroid disease   . COPD (chronic obstructive pulmonary disease)   . Sciatica   . Bulging lumbar disc   . Gall stones    Past Surgical History  Procedure Laterality Date  . Tubal ligation    . Knee surgery    . Hernia repair     No family history on file. History  Substance Use Topics  . Smoking status: Current Every Day Smoker -- 0.50 packs/day    Types: Cigarettes  . Smokeless tobacco: Never Used  . Alcohol Use: No     Comment: RARE   OB History    No data available     Review of Systems  Gastrointestinal: Positive for nausea and abdominal pain.  All other systems reviewed and are negative.     Allergies  Asa; Bactrim; Flagyl;  and Sulfa antibiotics  Home Medications   Prior to Admission medications   Medication Sig Start Date End Date Taking? Authorizing Provider  venlafaxine (EFFEXOR) 37.5 MG tablet Take 37.5 mg by mouth 2 (two) times daily.   Yes Historical Provider, MD  albuterol (PROVENTIL HFA;VENTOLIN HFA) 108 (90 BASE) MCG/ACT inhaler Inhale 2 puffs into the lungs every 6 (six) hours as needed for wheezing or shortness of breath.    Historical Provider, MD  baclofen (LIORESAL) 10 MG tablet Take 10 mg by mouth 3 (three) times daily.    Historical Provider, MD  clotrimazole (LOTRIMIN) 1 % cream Apply 1 application topically 2 (two) times daily.    Historical Provider, MD  DULoxetine (CYMBALTA) 20 MG capsule Take 20 mg by mouth daily.    Historical Provider, MD  DULoxetine (CYMBALTA) 30 MG capsule Take 30 mg by mouth daily.    Historical Provider, MD  escitalopram (LEXAPRO) 10 MG tablet Take 10 mg by mouth daily.    Historical Provider, MD  esomeprazole (NEXIUM) 40 MG capsule Take 40 mg by mouth daily at 12 noon.    Historical Provider, MD  fluticasone-salmeterol (ADVAIR HFA) 115-21 MCG/ACT inhaler Inhale 2 puffs into the lungs 2 (two) times daily.    Historical Provider, MD  HYDROcodone-acetaminophen (NORCO/VICODIN) 5-325 MG per tablet Take 1-2 tablets by mouth every 4 (four) hours as needed. 04/18/15   Kathrynn Speed, PA-C  ibuprofen (  ADVIL,MOTRIN) 800 MG tablet Take 1 tablet (800 mg total) by mouth every 8 (eight) hours as needed for mild pain. 10/19/14   Kristen N Ward, DO  memantine (NAMENDA) 5 MG tablet Take 5 mg by mouth 2 (two) times daily.    Historical Provider, MD  metoprolol (LOPRESSOR) 50 MG tablet Take 50 mg by mouth 2 (two) times daily.    Historical Provider, MD  oxyCODONE-acetaminophen (PERCOCET/ROXICET) 5-325 MG per tablet Take 1-2 tablets by mouth every 6 (six) hours as needed for severe pain. 05/02/14   Jerelyn Scott, MD  Pregabalin (LYRICA PO) Take by mouth.    Historical Provider, MD  promethazine  (PHENERGAN) 25 MG tablet Take 25 mg by mouth every 6 (six) hours as needed for nausea or vomiting.    Historical Provider, MD  simvastatin (ZOCOR) 20 MG tablet Take 20 mg by mouth daily.    Historical Provider, MD   BP 138/107 mmHg  Pulse 78  Temp(Src) 98.7 F (37.1 C) (Oral)  Resp 20  Ht  (1.575 m)  Wt 179 lb (81.194 kg)  BMI 32.73 kg/m2  SpO2 98%  LMP 04/14/2015 Physical Exam  Constitutional: She is oriented to person, place, and time. She appears well-developed and well-nourished. No distress.  Obese.  HENT:  Head: Normocephalic and atraumatic.  Mouth/Throat: Oropharynx is clear and moist.  Eyes: Conjunctivae and EOM are normal.  Neck: Normal range of motion. Neck supple.  Cardiovascular: Normal rate, regular rhythm and normal heart sounds.   Pulmonary/Chest: Effort normal and breath sounds normal. No respiratory distress.  Abdominal: Normal appearance and bowel sounds are normal. There is tenderness. There is no rigidity, no rebound and negative Murphy's sign. No hernia.  Generalized tenderness, worse right upper quadrant and epigastric with guarding. No peritoneal signs.  Musculoskeletal: Normal range of motion. She exhibits no edema.  Neurological: She is alert and oriented to person, place, and time. No sensory deficit.  Skin: Skin is warm and dry.  Psychiatric: She has a normal mood and affect. Her behavior is normal.  Nursing note and vitals reviewed.   ED Course  Procedures (including critical care time) Labs Review Labs Reviewed  URINALYSIS, ROUTINE W REFLEX MICROSCOPIC (NOT AT [facility]) - Abnormal; Notable for the following:    Hgb urine dipstick TRACE (*)    Leukocytes, UA TRACE (*)    All other components within normal limits  CBC - Abnormal; Notable for the following:    WBC 11.1 (*)    RDW 17.6 (*)    All other components within normal limits  COMPREHENSIVE METABOLIC PANEL - Abnormal; Notable for the following:    Potassium 3.4 (*)    Anion gap 3 (*)     All other components within normal limits  PREGNANCY, URINE  LIPASE, BLOOD  URINE MICROSCOPIC-ADD ON    Imaging Review US Abdomen Complete  04/18/2015   CLINICAL DATA:  Right upper quadrant and mid abdominal pain.  EXAM: ULTRASOUND ABDOMEN COMPLETE  COMPARISON:  03/30/2015  FINDINGS: Gallbladder: No gallstones or wall thickening visualized. No sonographic Murphy sign noted.  Common bile duct: Diameter: 8 mm.  Similar to previous exam.  Liver: Diffusely echogenic.  No focal abnormality.  IVC: No abnormality visualized.  Pancreas: Visualized portion unremarkable.  Spleen: Size and appearance within normal limits.  Right Kidney: Length: 11.5 cm. Echogenicity within normal limits. No mass or hydronephrosis visualized.  Left Kidney: Length: 10.5 cm. Echogenicity within normal limits. No mass or hydronephrosis visualized.  Abdominal aorta: No aneurysm  visualized.  Other findings: None.  IMPRESSION: 1. Increase caliber of the common bile duct. No choledocholithiasis or obstructing mass identified. If there is a concern for choledocholithiasis then an MRCP may provide additional clinical information. 2. Hepatic steatosis noted.   Electronically Signed   By: Signa Kell M.D.   On: 04/18/2015 18:56   Ct Abdomen Pelvis W Contrast  04/18/2015   CLINICAL DATA:  Right upper quadrant and right groin pain for several months. Nausea.  EXAM: CT ABDOMEN AND PELVIS WITH CONTRAST  TECHNIQUE: Multidetector CT imaging of the abdomen and pelvis was performed using the standard protocol following bolus administration of intravenous contrast.  CONTRAST:  OMNIPAQUE IOHEXOL 300 MG/ML  SOLN  COMPARISON:  Noncontrast CT on 07/13/2014  FINDINGS: Lower Chest: No acute findings.  Hepatobiliary: Severe hepatic steatosis demonstrated. No liver masses visualized. Gallbladder is unremarkable.  Pancreas: No mass, inflammatory changes, or other significant abnormality identified.  Spleen:  Within normal limits in size and  appearance.  Adrenals:  No masses identified.  Kidneys/Urinary Tract:  No evidence of masses or hydronephrosis.  Stomach/Bowel/Peritoneum: No evidence of wall thickening, mass, or obstruction.  Vascular/Lymphatic: No pathologically enlarged lymph nodes identified. No abdominal aortic aneurysm or other significant retroperitoneal abnormality demonstrated.  Reproductive:  No mass or other significant abnormality identified.  Other:  None.  Musculoskeletal:  No suspicious bone lesions identified.  IMPRESSION: No acute findings.  Severe diffuse hepatic steatosis.   Electronically Signed   By: Myles Rosenthal M.D.   On: 04/18/2015 20:36     EKG Interpretation None      MDM   Final diagnoses:  RUQ abdominal pain  Epigastric abdominal pain  Nausea  Hepatic steatosis   Non-toxic appearing, NAD. Abdomen is soft with no peritoneal signs. Increased tenderness to right upper quadrant and epigastric. Reported history of gallstones. Initial concern for possible cholecystitis or obstructing stone. Abdominal ultrasound showing increased caliber of the common bile duct, no choledocholithiasis or obstructing mass, hepatic steatosis. No acute findings. Diameter of common bile duct 8 mm which is similar to previous exam. Patient reporting continued pain after receiving morphine and having ultrasound, on repeat exam, abdomen still very tender in right upper quadrant and epigastrium. CT obtained for further evaluation to rule out any other acute intra-abdominal finding. CT showing severe diffuse hepatic steatosis, no acute findings. Prior to that examination, patient was given a dose of Dilaudid. Patient reports her pain has slightly decreased. On repeat exam, no longer generally tender, has tenderness in right upper quadrant and epigastrium, improved from initial exam. Requesting something to eat as she is very hungry. Tolerating PO without difficulty. Regarding the pain in her groin, no hernia palpated. Pain is not  reproducible. No focal right lower quadrant tenderness. Will discharge patient home with pain medication, states she has antiemetics at home. Advised her to follow-up with her PCP within one week as she may need an MRCP. Stable for discharge. Return precautions given. Patient states understanding of treatment care plan and is agreeable.  Kathrynn Speed, PA-C 04/18/15 2113  Tilden Fossa, MD 04/18/15 [phone]

## 2015-04-18 NOTE — ED Notes (Signed)
CT dept called notified pt ready for transport to CT

## 2015-04-18 NOTE — Discharge Instructions (Signed)
Take Vicodin for severe pain only. No driving or operating heavy machinery while taking vicodin. This medication may cause drowsiness. Use the medication you have at home for nausea as needed for nausea.  Abdominal Pain Many things can cause abdominal pain. Usually, abdominal pain is not caused by a disease and will improve without treatment. It can often be observed and treated at home. Your health care provider will do a physical exam and possibly order blood tests and X-rays to help determine the seriousness of your pain. However, in many cases, more time must pass before a clear cause of the pain can be found. Before that point, your health care provider may not know if you need more testing or further treatment. HOME CARE INSTRUCTIONS  Monitor your abdominal pain for any changes. The following actions may help to alleviate any discomfort you are experiencing:  Only take over-the-counter or prescription medicines as directed by your health care provider.  Do not take laxatives unless directed to do so by your health care provider.  Try a clear liquid diet (broth, tea, or water) as directed by your health care provider. Slowly move to a bland diet as tolerated. SEEK MEDICAL CARE IF:  You have unexplained abdominal pain.  You have abdominal pain associated with nausea or diarrhea.  You have pain when you urinate or have a bowel movement.  You experience abdominal pain that wakes you in the night.  You have abdominal pain that is worsened or improved by eating food.  You have abdominal pain that is worsened with eating fatty foods.  You have a fever. SEEK IMMEDIATE MEDICAL CARE IF:   Your pain does not go away within 2 hours.  You keep throwing up (vomiting).  Your pain is felt only in portions of the abdomen, such as the right side or the left lower portion of the abdomen.  You pass bloody or black tarry stools. MAKE SURE YOU:  Understand these instructions.   Will watch  your condition.   Will get help right away if you are not doing well or get worse.  Document Released: 06/18/2005 Document Revised: 09/13/2013 Document Reviewed: 05/18/2013 [facility] Patient Information 2015 [place], [place]. This information is not intended to replace advice given to you by your health care provider. Make sure you discuss any questions you have with your health care provider.  Nausea, Adult Nausea is the feeling that you have an upset stomach or have to vomit. Nausea by itself is not likely a serious concern, but it may be an early sign of more serious medical problems. As nausea gets worse, it can lead to vomiting. If vomiting develops, there is the risk of dehydration.  CAUSES   Viral infections.  Food poisoning.  Medicines.  Pregnancy.  Motion sickness.  Migraine headaches.  Emotional distress.  Severe pain from any source.  Alcohol intoxication. HOME CARE INSTRUCTIONS  Get plenty of rest.  Ask your caregiver about specific rehydration instructions.  Eat small amounts of food and sip liquids more often.  Take all medicines as told by your caregiver. SEEK MEDICAL CARE IF:  You have not improved after 2 days, or you get worse.  You have a headache. SEEK IMMEDIATE MEDICAL CARE IF:   You have a fever.  You faint.  You keep vomiting or have blood in your vomit.  You are extremely weak or dehydrated.  You have dark or bloody stools.  You have severe chest or abdominal pain. MAKE SURE YOU:  Understand these  instructions.  Will watch your condition.  Will get help right away if you are not doing well or get worse. Document Released: 10/16/2004 Document Revised: 06/02/2012 Document Reviewed: 05/21/2011 [facility] Patient Information 2015 [place], [place]. This information is not intended to replace advice given to you by your health care provider. Make sure you discuss any questions you have with your health care provider.  HIDA (Hepatobiliary)  Scan Your caregiver has suggested that you have a HIDA Scan. This is also known as a hepatobiliary scan. The HIDA Scan helps evaluate the hepatobiliary system (liver and gallbladder and their ducts). Your liver is the organ in your body that produces bile. The bile is then collected in the gallbladder. The bile is stored and concentrated in the gallbladder. The bile is excreted (passed) into the small intestine when it is needed for digestion. A stone can block the duct (tube) leading from the gallbladder to the small intestine. This can cause an inflammation of the gallbladder (cholecystitis). Because bile is always needed for fat processing, you may feel a gallbladder attack especially after eating a fatty meal. LET YOUR CAREGIVER KNOW ABOUT:  Allergies.  Medications taken including herbs, eye drops, over the counter medications, and creams.  Use of steroids (by mouth or creams).  Previous problems with anesthetics or novocaine.  Possibility of pregnancy, if this applies.  History of blood clots (thrombophlebitis).  History of bleeding or blood problems.  Previous surgery.  Other health problems. BEFORE THE PROCEDURE  Do not eat or drink anything after midnight the night before the exam as instructed.  You may take medications with a small amount of water the morning of the exam unless your caregiver instructs you otherwise. You should be present 60 minutes prior to your procedure or as directed.  PROCEDURE   An IV will be placed in your arm and remain throughout the exam.  A small amount of very short acting radioactive material will be injected into the IV.  While lying down a special camera will be placed over your abdomen (belly). This camera is used to detect the injected material. The camera will place images on film. A radiologist (specialist in reading x-rays) can evaluate the images. It will help determine how well your gallbladder is working.  You will then be given a  material called CCK. This will make your gallbladder contract. It occasionally causes symptoms (problems) that mimic a gallbladder attack or the feeling you have after eating a fatty meal.  The entire test usually takes one to two hours. Your caregiver can give you more accurate times. Following the test you may go home and resume normal activities and diet as instructed. Ask your caregiver how you are to find out your results. Remember, it is your responsibility to find out the results of your test. Do not assume everything is all right or "normal" if you have not heard from your caregiver. Document Released: 09/05/2000 Document Revised: 12/01/2011 Document Reviewed: 01/11/2014 [facility] Patient Information 2015 [place], [place]. This information is not intended to replace advice given to you by your health care provider. Make sure you discuss any questions you have with your health care provider.

## 2015-04-18 NOTE — ED Notes (Signed)
C/o abd pain x 1-2 months-US last month reads "gallstones and fluid in my gallbladder"-appt with Dr Maisie Fus at [facility] 8/10

## 2015-05-28 ENCOUNTER — Emergency Department (HOSPITAL_BASED_OUTPATIENT_CLINIC_OR_DEPARTMENT_OTHER)
Admission: EM | Admit: 2015-05-28 | Discharge: 2015-05-28 | Disposition: A | Discharge status: Home / Self Care | Payer: Medicaid Other | Attending: Emergency Medicine | Admitting: Emergency Medicine

## 2015-05-28 ENCOUNTER — Encounter (HOSPITAL_BASED_OUTPATIENT_CLINIC_OR_DEPARTMENT_OTHER): Payer: Self-pay | Admitting: *Deleted

## 2015-05-28 DIAGNOSIS — Z720 Tobacco use: Secondary | ICD-10-CM | POA: Insufficient documentation

## 2015-05-28 DIAGNOSIS — F329 Major depressive disorder, single episode, unspecified: Secondary | ICD-10-CM | POA: Diagnosis not present

## 2015-05-28 DIAGNOSIS — J449 Chronic obstructive pulmonary disease, unspecified: Secondary | ICD-10-CM | POA: Insufficient documentation

## 2015-05-28 DIAGNOSIS — M79604 Pain in right leg: Secondary | ICD-10-CM | POA: Insufficient documentation

## 2015-05-28 DIAGNOSIS — Z8639 Personal history of other endocrine, nutritional and metabolic disease: Secondary | ICD-10-CM | POA: Diagnosis not present

## 2015-05-28 DIAGNOSIS — Z79899 Other long term (current) drug therapy: Secondary | ICD-10-CM | POA: Insufficient documentation

## 2015-05-28 DIAGNOSIS — I10 Essential (primary) hypertension: Secondary | ICD-10-CM | POA: Insufficient documentation

## 2015-05-28 DIAGNOSIS — Z8719 Personal history of other diseases of the digestive system: Secondary | ICD-10-CM | POA: Insufficient documentation

## 2015-05-28 HISTORY — DX: Essential (primary) hypertension: I10

## 2015-05-28 NOTE — Discharge Instructions (Signed)
Please follow up with your Neurologist in the next week. Please continue taking your prescriptions of Lyrica, oxycodone and ibuprofen for pain control.  Please return to the Emergency Department if symptoms worsen or new onset of SOB, CP, swelling, weakness, numbness, tingling.

## 2015-05-28 NOTE — ED Notes (Signed)
C/o right knee pain to right foot on top of leg  Pain that started last night. No injury. C/o knot to lateral side of shin that has been there awhile.

## 2015-05-28 NOTE — ED Provider Notes (Signed)
CSN: [id number]     Arrival date & time 05/28/15  0737 History   First MD Initiated Contact with Patient 05/28/15 [phone]     No chief complaint on file.    (Consider location/radiation/quality/duration/timing/severity/associated sxs/prior Treatment) HPI Comments: Pt is a 49 yo female with history of COPD, sciatica who presents to the ED with complaint of right lower leg/foot pain, onset yesterday. Pt reports she was sitting on the cough and when she tried to get up and started to have constant "intense" pain to her right knee, shin and dorsal foot and reports that this pain feels different than her sciatica but feels similar to her "nerve pain". She denies any injury. Pt states she has been taking her Lyrica and oxycodone at home with mild relief. She also notes having a knot on her lateral right shin that has been there for years. Pt also endorses history of right lateral hip pain. Denies weakness, numbness, tingling, SOB, CP. Denies any aggravating or relieving factors. Pt states she uses a walker at home to ambulate at baseline due to her sciatica. Pt is using a cane to ambulate in the ED today.   Past Medical History  Diagnosis Date  . Hyperlipemia   . Depression   . High cholesterol   . Thyroid disease   . COPD (chronic obstructive pulmonary disease)   . Sciatica   . Bulging lumbar disc   . Gall stones   . Hypertension    Past Surgical History  Procedure Laterality Date  . Tubal ligation    . Knee surgery    . Hernia repair     No family history on file. Social History  Substance Use Topics  . Smoking status: Current Every Day Smoker -- 0.50 packs/day    Types: Cigarettes  . Smokeless tobacco: Never Used  . Alcohol Use: No     Comment: RARE   OB History    No data available     Review of Systems  Constitutional: Negative for fever.  Respiratory: Negative for shortness of breath.   Cardiovascular: Negative for chest pain.  Musculoskeletal: Negative for back pain.   Right lower leg pain.  Neurological: Negative for weakness and numbness.      Allergies  Asa; Bactrim; Flagyl; and Sulfa antibiotics  Home Medications   Prior to Admission medications   Medication Sig Start Date End Date Taking? Authorizing Provider  albuterol (PROVENTIL HFA;VENTOLIN HFA) 108 (90 BASE) MCG/ACT inhaler Inhale 2 puffs into the lungs every 6 (six) hours as needed for wheezing or shortness of breath.   Yes Historical Provider, MD  baclofen (LIORESAL) 10 MG tablet Take 10 mg by mouth 3 (three) times daily.   Yes Historical Provider, MD  DULoxetine (CYMBALTA) 20 MG capsule Take 20 mg by mouth daily.   Yes Historical Provider, MD  DULoxetine (CYMBALTA) 30 MG capsule Take 30 mg by mouth daily.   Yes Historical Provider, MD  esomeprazole (NEXIUM) 40 MG capsule Take 40 mg by mouth daily at 12 noon.   Yes Historical Provider, MD  fluticasone-salmeterol (ADVAIR HFA) 115-21 MCG/ACT inhaler Inhale 2 puffs into the lungs 2 (two) times daily.   Yes Historical Provider, MD  HYDROcodone-acetaminophen (NORCO/VICODIN) 5-325 MG per tablet Take 1-2 tablets by mouth every 4 (four) hours as needed. 04/18/15  Yes Robyn M Hess, PA-C  ibuprofen (ADVIL,MOTRIN) 800 MG tablet Take 1 tablet (800 mg total) by mouth every 8 (eight) hours as needed for mild pain. 10/19/14  Yes Layla Maw  Ward, DO  memantine (NAMENDA) 5 MG tablet Take 5 mg by mouth 2 (two) times daily.   Yes Historical Provider, MD  metoprolol (LOPRESSOR) 50 MG tablet Take 50 mg by mouth 2 (two) times daily.   Yes Historical Provider, MD  oxyCODONE-acetaminophen (PERCOCET/ROXICET) 5-325 MG per tablet Take 1-2 tablets by mouth every 6 (six) hours as needed for severe pain. 05/02/14  Yes Jerelyn Scott, MD  Pregabalin (LYRICA PO) Take by mouth.   Yes Historical Provider, MD  promethazine (PHENERGAN) 25 MG tablet Take 25 mg by mouth every 6 (six) hours as needed for nausea or vomiting.   Yes Historical Provider, MD  venlafaxine (EFFEXOR) 37.5 MG  tablet Take 37.5 mg by mouth 2 (two) times daily.   Yes Historical Provider, MD   BP 157/78 mmHg  Pulse 69  Temp(Src) 98.8 F (37.1 C)  Resp 16  Ht 5\' 2"  (1.575 m)  Wt 181 lb (82.101 kg)  BMI 33.10 kg/m2  SpO2 100%  LMP 03/27/2015 Physical Exam  Constitutional: She is oriented to person, place, and time. She appears well-developed and well-nourished.  HENT:  Head: Normocephalic and atraumatic.  Eyes: Conjunctivae and EOM are normal. Right eye exhibits no discharge. Left eye exhibits no discharge. No scleral icterus.  Neck: Normal range of motion. Neck supple.  Cardiovascular: Normal rate.   Pulmonary/Chest: Effort normal.  Musculoskeletal: Normal range of motion. She exhibits no edema or tenderness.       Right lower leg: Normal. She exhibits no tenderness, no bony tenderness and no deformity.  FROM of right lower extremity, sensation intact, 5/5 strength, no swelling/contusion/abrasion/erythema noted.   Neurological: She is alert and oriented to person, place, and time. She has normal reflexes.  Pt able to ambulate in room with cane (pt uses walker to ambulate at home due to sciatica).  Nursing note and vitals reviewed.   ED Course  Procedures (including critical care time) Labs Review Labs Reviewed - No data to display  Imaging Review No results found. I have personally reviewed and evaluated these images and lab results as part of my medical decision-making.    MDM   Final diagnoses:  Right leg pain    Pt presents with right lower leg pain. No injury. VSS. FROM of right lower extremity, 5/5 strength, no swelling, no TTP, DTRs normal. I do not feel that imaging is warranted at this time. Pain likely associated with pt's chronic neuropathy. I do not suspect DVT/PE at this time, Well's score 0. Advised pt to follow up with her Neurologist in the next week and to continue taking Lyrica, oxycodone and ibuprofen for pain relief.   Evaluation does not show pathology  requring ongoing emergent intervention or admission. Pt is hemodynamically stable and mentating appropriately. Discussed findings/results and plan with patient/guardian, who agrees with plan. All questions answered. Return precautions discussed and outpatient follow up given.      Satira Sark [place], [place] 05/28/15 1025  Geoffery Lyons, MD 05/28/15 1027

## 2015-06-28 ENCOUNTER — Emergency Department (HOSPITAL_BASED_OUTPATIENT_CLINIC_OR_DEPARTMENT_OTHER)
Admission: EM | Admit: 2015-06-28 | Discharge: 2015-06-28 | Disposition: A | Discharge status: Home / Self Care | Payer: Medicaid Other | Attending: Emergency Medicine | Admitting: Emergency Medicine

## 2015-06-28 ENCOUNTER — Telehealth: Payer: Self-pay | Admitting: *Deleted

## 2015-06-28 ENCOUNTER — Encounter (HOSPITAL_BASED_OUTPATIENT_CLINIC_OR_DEPARTMENT_OTHER): Payer: Self-pay

## 2015-06-28 DIAGNOSIS — Z8719 Personal history of other diseases of the digestive system: Secondary | ICD-10-CM | POA: Diagnosis not present

## 2015-06-28 DIAGNOSIS — H109 Unspecified conjunctivitis: Secondary | ICD-10-CM | POA: Diagnosis not present

## 2015-06-28 DIAGNOSIS — Z7951 Long term (current) use of inhaled steroids: Secondary | ICD-10-CM | POA: Diagnosis not present

## 2015-06-28 DIAGNOSIS — Z720 Tobacco use: Secondary | ICD-10-CM | POA: Diagnosis not present

## 2015-06-28 DIAGNOSIS — Z8739 Personal history of other diseases of the musculoskeletal system and connective tissue: Secondary | ICD-10-CM | POA: Diagnosis not present

## 2015-06-28 DIAGNOSIS — F329 Major depressive disorder, single episode, unspecified: Secondary | ICD-10-CM | POA: Diagnosis not present

## 2015-06-28 DIAGNOSIS — J449 Chronic obstructive pulmonary disease, unspecified: Secondary | ICD-10-CM | POA: Diagnosis not present

## 2015-06-28 DIAGNOSIS — I10 Essential (primary) hypertension: Secondary | ICD-10-CM | POA: Insufficient documentation

## 2015-06-28 DIAGNOSIS — Z79899 Other long term (current) drug therapy: Secondary | ICD-10-CM | POA: Insufficient documentation

## 2015-06-28 DIAGNOSIS — Z8639 Personal history of other endocrine, nutritional and metabolic disease: Secondary | ICD-10-CM | POA: Insufficient documentation

## 2015-06-28 DIAGNOSIS — H5711 Ocular pain, right eye: Secondary | ICD-10-CM | POA: Diagnosis present

## 2015-06-28 MED ORDER — FLUORESCEIN SODIUM 1 MG OP STRP
ORAL_STRIP | OPHTHALMIC | Status: AC
  Administered 2015-06-28: 04:00:00
  Filled 2015-06-28: qty 1

## 2015-06-28 MED ORDER — ERYTHROMYCIN 5 MG/GM OP OINT: TOPICAL_OINTMENT | OPHTHALMIC | Status: DC

## 2015-06-28 MED ORDER — TETRACAINE HCL 0.5 % OP SOLN
OPHTHALMIC | Status: AC
  Administered 2015-06-28: 04:00:00
  Filled 2015-06-28: qty 2

## 2015-06-28 NOTE — ED Notes (Signed)
Pt c/o rt eye pain, states it woke her up from her sleep, denies injury; c/o pain, drainage and sensitive to light

## 2015-06-28 NOTE — ED Provider Notes (Signed)
CSN: [id number]     Arrival date & time 06/28/15  0303 History   First MD Initiated Contact with Patient 06/28/15 [phone]     Chief Complaint  Patient presents with  . Eye Pain     (Consider location/radiation/quality/duration/timing/severity/associated sxs/prior Treatment) HPI  This is a 49 year old female who presents with right eye pain. Patient reports that she woke up from sleep with right eye pain. She has a foreign body sensation. Patient does not wear contacts. Reports drainage and photosensitivity. Currently rates her pain 8 out of 10. She's currently taking pain medication for unrelated recent oral surgery.  Denies any injury. Denies any exposures. Denies any infectious or allergic symptoms.  Past Medical History  Diagnosis Date  . Hyperlipemia   . Depression   . High cholesterol   . Thyroid disease   . COPD (chronic obstructive pulmonary disease) (HCC)   . Sciatica   . Bulging lumbar disc   . Gall stones   . Hypertension    Past Surgical History  Procedure Laterality Date  . Tubal ligation    . Knee surgery    . Hernia repair     No family history on file. Social History  Substance Use Topics  . Smoking status: Current Every Day Smoker -- 0.50 packs/day    Types: Cigarettes  . Smokeless tobacco: Never Used  . Alcohol Use: No     Comment: RARE   OB History    No data available     Review of Systems  Constitutional: Negative for fever.  HENT: Positive for dental problem.   Eyes: Positive for photophobia, pain, discharge, redness and visual disturbance.  Respiratory: Negative for cough and shortness of breath.   Cardiovascular: Negative for chest pain.  All other systems reviewed and are negative.     Allergies  Asa; Bactrim; Flagyl; and Sulfa antibiotics  Home Medications   Prior to Admission medications   Medication Sig Start Date End Date Taking? Authorizing Provider  albuterol (PROVENTIL HFA;VENTOLIN HFA) 108 (90 BASE) MCG/ACT inhaler Inhale 2  puffs into the lungs every 6 (six) hours as needed for wheezing or shortness of breath.    Historical Provider, MD  baclofen (LIORESAL) 10 MG tablet Take 10 mg by mouth 3 (three) times daily.    Historical Provider, MD  DULoxetine (CYMBALTA) 20 MG capsule Take 20 mg by mouth daily.    Historical Provider, MD  DULoxetine (CYMBALTA) 30 MG capsule Take 30 mg by mouth daily.    Historical Provider, MD  erythromycin ophthalmic ointment Place a 1/2 inch ribbon of ointment into the lower eyelid. 06/28/15   Shon Baton, MD  esomeprazole (NEXIUM) 40 MG capsule Take 40 mg by mouth daily at 12 noon.    Historical Provider, MD  fluticasone-salmeterol (ADVAIR HFA) 115-21 MCG/ACT inhaler Inhale 2 puffs into the lungs 2 (two) times daily.    Historical Provider, MD  HYDROcodone-acetaminophen (NORCO/VICODIN) 5-325 MG per tablet Take 1-2 tablets by mouth every 4 (four) hours as needed. 04/18/15   Robyn M Hess, PA-C  ibuprofen (ADVIL,MOTRIN) 800 MG tablet Take 1 tablet (800 mg total) by mouth every 8 (eight) hours as needed for mild pain. 10/19/14   Kristen N Ward, DO  memantine (NAMENDA) 5 MG tablet Take 5 mg by mouth 2 (two) times daily.    Historical Provider, MD  metoprolol (LOPRESSOR) 50 MG tablet Take 50 mg by mouth 2 (two) times daily.    Historical Provider, MD  oxyCODONE-acetaminophen (PERCOCET/ROXICET) 5-325 MG per  tablet Take 1-2 tablets by mouth every 6 (six) hours as needed for severe pain. 05/02/14   Jerelyn Scott, MD  Pregabalin (LYRICA PO) Take by mouth.    Historical Provider, MD  promethazine (PHENERGAN) 25 MG tablet Take 25 mg by mouth every 6 (six) hours as needed for nausea or vomiting.    Historical Provider, MD  venlafaxine (EFFEXOR) 37.5 MG tablet Take 37.5 mg by mouth 2 (two) times daily.    Historical Provider, MD   BP 156/82 mmHg  Pulse 68  Temp(Src) 98.2 F (36.8 C) (Oral)  Resp 18  Ht  (1.575 m)  Wt 177 lb (80.287 kg)  BMI 32.37 kg/m2  SpO2 98%  LMP 06/12/2015 Physical  Exam  Constitutional: She is oriented to person, place, and time. No distress.  HENT:  Head: Normocephalic and atraumatic.  Mouth/Throat: Oropharynx is clear and moist.  Mild swelling noted over the right lower jawline, recent tooth extraction  Eyes: Pupils are equal, round, and reactive to light.  Mild conjunctival injection on the right, extraocular movements intact, pupils 5 mm reactive bilaterally, no foreign body noted with lid inversion, no fluoroscein uptake noted, visual acuity unable to obtain as patient did not bring her glasses  Cardiovascular: Normal rate and regular rhythm.   Pulmonary/Chest: Effort normal. No respiratory distress.  Neurological: She is alert and oriented to person, place, and time.  Skin: Skin is warm and dry.  Psychiatric: She has a normal mood and affect.  Nursing note and vitals reviewed.   ED Course  Procedures (including critical care time) Labs Review Labs Reviewed - No data to display  Imaging Review No results found. I have personally reviewed and evaluated these images and lab results as part of my medical decision-making.   EKG Interpretation None      MDM   Final diagnoses:  Conjunctivitis of right eye    Patient presents with right eye pain, photosensitivity and drainage. No foreign body noted.  Patient does not work contacts. No evidence of corneal abrasion. No evidence of orbital cellulitis. There is mild conjunctival injection suggestive of early conjunctivitis. Likely viral. Discussed this with the patient and her daughter. Patient was given erythromycin ointment. She and her daughter were given strict return precautions.  After history, exam, and medical workup I feel the patient has been appropriately medically screened and is safe for discharge home. Pertinent diagnoses were discussed with the patient. Patient was given return precautions.     Shon Baton, MD 06/28/15 [phone]

## 2015-06-28 NOTE — Telephone Encounter (Signed)
Pharmacy called related to Rx: erythromycin ophthalmic ointment .Marland KitchenMarland KitchenNCM clarified with EDP instructions QID x 7 days--.

## 2015-06-28 NOTE — Discharge Instructions (Signed)
You were seen today for pain. Your workup is reassuring. There is no evidence of foreign body or abrasion to the eye. You may have mild conjunctivitis. This is often times viral. However, you will be given antibiotics ointment.  Good handwashing is very important.  Viral Conjunctivitis Viral conjunctivitis is an inflammation of the clear membrane that covers the white part of your eye and the inner surface of your eyelid (conjunctiva). The inflammation is caused by a viral infection. The blood vessels in the conjunctiva become inflamed, causing the eye to become red or pink, and often itchy. Viral conjunctivitis can easily be passed from one person to another (contagious). CAUSES  Viral conjunctivitis is caused by a virus. A virus is a type of contagious germ. It can be spread by touching objects that have been contaminated with the virus, such as doorknobs or towels.  SYMPTOMS  Symptoms of viral conjunctivitis may include:   Eye redness.  Tearing or watery eyes.  Itchy eyes.  Burning feeling in the eyes.  Clear drainage from the eye.  Swollen eyelids.  A gritty feeling in the eye.  Light sensitivity. DIAGNOSIS  Viral conjunctivitis may be diagnosed with a medical history and physical exam. If you have discharge from your eye, the discharge may be tested to rule out other causes of conjunctivitis.  TREATMENT  Viral conjunctivitis does not respond to medicines that kill bacteria (antibiotics). Treatment for viral conjunctivitis is directed at stopping a bacterial infection from developing in addition to the viral infection. Treatment also aims to relieve your symptoms, such as itching. This may be done with antihistamine drops or other eye medicines. HOME CARE INSTRUCTIONS  Take medicines only as directed by your health care provider.  Avoid touching or rubbing your eyes.  Apply a warm, clean washcloth to your eye for 10-20 minutes, 3-4 times per day.  If you wear contact lenses,  do not wear them until the inflammation is gone and your health care provider says it is safe to wear them again. Ask your health care provider how to sterilize or replace your contact lenses before using them again. Wear glasses until you can resume wearing contacts.  Avoid wearing eye makeup until the inflammation is gone. Throw away any old eye cosmetics that may be contaminated.  Change or wash your pillowcase every day.  Do not share towels or washcloths. This may spread the infection.  Wash your hands often with soap and water. Use paper towels to dry your hands.  Gently wipe away any drainage from your eye with a warm, wet washcloth or a cotton ball.  Be very careful to avoid touching the edge of the eyelid with the eye drop bottle or ointment tube when applying medicines to the affected eye. This will stop you from spreading the infection to the other eye or to other people. SEEK MEDICAL CARE IF:   Your symptoms do not improve with treatment.  You have increased pain.  Your vision becomes blurry.  You have a fever.  You have facial pain, redness, or swelling.  You have new symptoms.  Your symptoms get worse.   This information is not intended to replace advice given to you by your health care provider. Make sure you discuss any questions you have with your health care provider.   Document Released: 11/29/2002 Document Revised: 03/02/2006 Document Reviewed: 06/20/2014 Elsevier Interactive Patient Education [employer]

## 2015-12-09 ENCOUNTER — Emergency Department (HOSPITAL_BASED_OUTPATIENT_CLINIC_OR_DEPARTMENT_OTHER)
Admission: EM | Admit: 2015-12-09 | Discharge: 2015-12-09 | Disposition: A | Discharge status: Home / Self Care | Payer: Medicaid Other | Attending: Emergency Medicine | Admitting: Emergency Medicine

## 2015-12-09 ENCOUNTER — Encounter (HOSPITAL_BASED_OUTPATIENT_CLINIC_OR_DEPARTMENT_OTHER): Payer: Self-pay

## 2015-12-09 ENCOUNTER — Emergency Department (HOSPITAL_BASED_OUTPATIENT_CLINIC_OR_DEPARTMENT_OTHER): Payer: Medicaid Other

## 2015-12-09 DIAGNOSIS — W010XXA Fall on same level from slipping, tripping and stumbling without subsequent striking against object, initial encounter: Secondary | ICD-10-CM | POA: Diagnosis not present

## 2015-12-09 DIAGNOSIS — S79911A Unspecified injury of right hip, initial encounter: Secondary | ICD-10-CM | POA: Insufficient documentation

## 2015-12-09 DIAGNOSIS — G8929 Other chronic pain: Secondary | ICD-10-CM | POA: Insufficient documentation

## 2015-12-09 DIAGNOSIS — J449 Chronic obstructive pulmonary disease, unspecified: Secondary | ICD-10-CM | POA: Insufficient documentation

## 2015-12-09 DIAGNOSIS — Z8719 Personal history of other diseases of the digestive system: Secondary | ICD-10-CM | POA: Insufficient documentation

## 2015-12-09 DIAGNOSIS — Y9389 Activity, other specified: Secondary | ICD-10-CM | POA: Insufficient documentation

## 2015-12-09 DIAGNOSIS — Y998 Other external cause status: Secondary | ICD-10-CM | POA: Diagnosis not present

## 2015-12-09 DIAGNOSIS — Z8739 Personal history of other diseases of the musculoskeletal system and connective tissue: Secondary | ICD-10-CM | POA: Diagnosis not present

## 2015-12-09 DIAGNOSIS — Z79899 Other long term (current) drug therapy: Secondary | ICD-10-CM | POA: Insufficient documentation

## 2015-12-09 DIAGNOSIS — F329 Major depressive disorder, single episode, unspecified: Secondary | ICD-10-CM | POA: Insufficient documentation

## 2015-12-09 DIAGNOSIS — F17210 Nicotine dependence, cigarettes, uncomplicated: Secondary | ICD-10-CM | POA: Diagnosis not present

## 2015-12-09 DIAGNOSIS — Z8639 Personal history of other endocrine, nutritional and metabolic disease: Secondary | ICD-10-CM | POA: Diagnosis not present

## 2015-12-09 DIAGNOSIS — Y92019 Unspecified place in single-family (private) house as the place of occurrence of the external cause: Secondary | ICD-10-CM | POA: Diagnosis not present

## 2015-12-09 DIAGNOSIS — Z7951 Long term (current) use of inhaled steroids: Secondary | ICD-10-CM | POA: Diagnosis not present

## 2015-12-09 DIAGNOSIS — I10 Essential (primary) hypertension: Secondary | ICD-10-CM | POA: Insufficient documentation

## 2015-12-09 DIAGNOSIS — M25551 Pain in right hip: Secondary | ICD-10-CM

## 2015-12-09 MED ORDER — HYDROMORPHONE HCL 1 MG/ML IJ SOLN
2.0000 mg | Freq: Once | INTRAMUSCULAR | Status: AC
  Administered 2015-12-09: 2 mg via INTRAMUSCULAR
  Filled 2015-12-09: qty 2

## 2015-12-09 NOTE — ED Notes (Signed)
Patient transported to X-ray 

## 2015-12-09 NOTE — ED Notes (Signed)
Patient here with right hip pain x 1 week after falling in home. Patient uses cane and walker due to mobility problems with chronic back pain

## 2015-12-09 NOTE — ED Provider Notes (Signed)
CSN: [id number]     Arrival date & time 12/09/15  0830 History   First MD Initiated Contact with Patient 12/09/15 [phone]     Chief Complaint  Patient presents with  . Hip Pain     (Consider location/radiation/quality/duration/timing/severity/associated sxs/prior Treatment) HPI  50 year old female presents with right hip/buttocks pain since falling multiple 4 days ago. Patient states she has chronic balance and gait issues because of chronic sciatica. She fell and landed on her right side. Has been having painful walking and pain at rest in her right hip. Points over to which her greater trochanter. Sometimes the pain radiates down to her knee. No increase or change in back pain. No head injury or weakness or numbness. Pain is severe. She has been taking her chronic oxycodone and ibuprofen with temporary relief.  Past Medical History  Diagnosis Date  . Hyperlipemia   . Depression   . High cholesterol   . Thyroid disease   . COPD (chronic obstructive pulmonary disease) (HCC)   . Sciatica   . Bulging lumbar disc   . Gall stones   . Hypertension    Past Surgical History  Procedure Laterality Date  . Tubal ligation    . Knee surgery    . Hernia repair     No family history on file. Social History  Substance Use Topics  . Smoking status: Current Every Day Smoker -- 0.50 packs/day    Types: Cigarettes  . Smokeless tobacco: Never Used  . Alcohol Use: No     Comment: RARE   OB History    No data available     Review of Systems  Musculoskeletal: Positive for arthralgias.  Neurological: Negative for dizziness, weakness, numbness and headaches.  All other systems reviewed and are negative.     Allergies  Asa; Bactrim; Flagyl; and Sulfa antibiotics  Home Medications   Prior to Admission medications   Medication Sig Start Date End Date Taking? Authorizing Provider  baclofen (LIORESAL) 10 MG tablet Take 10 mg by mouth 3 (three) times daily.    Historical Provider, MD    DULoxetine (CYMBALTA) 20 MG capsule Take 20 mg by mouth daily.    Historical Provider, MD  DULoxetine (CYMBALTA) 30 MG capsule Take 30 mg by mouth daily.    Historical Provider, MD  esomeprazole (NEXIUM) 40 MG capsule Take 40 mg by mouth daily at 12 noon.    Historical Provider, MD  fluticasone-salmeterol (ADVAIR HFA) 115-21 MCG/ACT inhaler Inhale 2 puffs into the lungs 2 (two) times daily.    Historical Provider, MD  ibuprofen (ADVIL,MOTRIN) 800 MG tablet Take 1 tablet (800 mg total) by mouth every 8 (eight) hours as needed for mild pain. 10/19/14   Kristen N Ward, DO  memantine (NAMENDA) 5 MG tablet Take 5 mg by mouth 2 (two) times daily.    Historical Provider, MD  metoprolol (LOPRESSOR) 50 MG tablet Take 50 mg by mouth 2 (two) times daily.    Historical Provider, MD  oxyCODONE-acetaminophen (PERCOCET/ROXICET) 5-325 MG per tablet Take 1-2 tablets by mouth every 6 (six) hours as needed for severe pain. 05/02/14   Jerelyn , MD  Pregabalin (LYRICA PO) Take by mouth.    Historical Provider, MD  promethazine (PHENERGAN) 25 MG tablet Take 25 mg by mouth every 6 (six) hours as needed for nausea or vomiting.    Historical Provider, MD  venlafaxine (EFFEXOR) 37.5 MG tablet Take 37.5 mg by mouth 2 (two) times daily.    Historical Provider, MD  BP 100/76 mmHg  Pulse 74  Temp(Src) 98.5 F (36.9 C) (Oral)  Resp 20  Ht 5\' 4"  (1.626 m)  Wt 186 lb (84.369 kg)  BMI 31.91 kg/m2  SpO2 98% Physical Exam  Constitutional: She is oriented to person, place, and time. She appears well-developed and well-nourished.  HENT:  Head: Normocephalic and atraumatic.  Right Ear: External ear normal.  Left Ear: External ear normal.  Nose: Nose normal.  Eyes: Right eye exhibits no discharge. Left eye exhibits no discharge.  Cardiovascular: Normal rate, regular rhythm, normal heart sounds and intact distal pulses.   Pulmonary/Chest: Effort normal and breath sounds normal.  Abdominal: Soft. She exhibits no  distension. There is no tenderness.  Musculoskeletal:       Right hip: She exhibits tenderness. She exhibits normal range of motion.       Right knee: She exhibits normal range of motion. No tenderness found.       Thoracic back: She exhibits no tenderness.       Lumbar back: She exhibits no tenderness.       Right upper leg: She exhibits no tenderness.       Legs: No ecchymosis  Neurological: She is alert and oriented to person, place, and time.  Skin: Skin is warm and dry.  Nursing note and vitals reviewed.   ED Course  Procedures (including critical care time) Labs Review Labs Reviewed - No data to display  Imaging Review Dg Hip Unilat With Pelvis 2-3 Views Right  12/09/2015  CLINICAL DATA:  Right hip pain for 1 week after fall. EXAM: DG HIP (WITH OR WITHOUT PELVIS) 2-3V RIGHT COMPARISON:  None. FINDINGS: Pelvic bony ring and pubic rami are intact. Normal appearance of the sacroiliac joints. Symmetric appearance of the hips. The right hip is located without acute fracture. Spurring and degenerative changes along the superior lateral aspect of the right acetabulum. IMPRESSION: No acute abnormality to the pelvis or right hip. Electronically Signed   By: Richarda OverlieAdam  Henn M.D.   On: 12/09/2015 09:50   I have personally reviewed and evaluated these images and lab results as part of my medical decision-making.   EKG Interpretation None      MDM   Final diagnoses:  Right hip pain    Patient likely bruised her lateral proximal hip/thigh. No obvious bruising seen and she has full range of motion. She is able to ambulate with a cane which is normal for her. X-ray shows no obvious fracture and I have very low suspicion for occult fracture. She's artery on chronic pain medicine, continue with this and follow-up with PCP.    Pricilla LovelessScott Lexii Walsh, MD 12/09/15 [phone]

## 2015-12-28 ENCOUNTER — Emergency Department (HOSPITAL_BASED_OUTPATIENT_CLINIC_OR_DEPARTMENT_OTHER)
Admission: EM | Admit: 2015-12-28 | Discharge: 2015-12-28 | Disposition: A | Discharge status: Home / Self Care | Payer: Medicaid Other | Attending: Emergency Medicine | Admitting: Emergency Medicine

## 2015-12-28 ENCOUNTER — Encounter (HOSPITAL_BASED_OUTPATIENT_CLINIC_OR_DEPARTMENT_OTHER): Payer: Self-pay | Admitting: *Deleted

## 2015-12-28 ENCOUNTER — Emergency Department (HOSPITAL_BASED_OUTPATIENT_CLINIC_OR_DEPARTMENT_OTHER): Payer: Medicaid Other

## 2015-12-28 DIAGNOSIS — M25551 Pain in right hip: Secondary | ICD-10-CM

## 2015-12-28 DIAGNOSIS — Z9889 Other specified postprocedural states: Secondary | ICD-10-CM | POA: Diagnosis not present

## 2015-12-28 DIAGNOSIS — I10 Essential (primary) hypertension: Secondary | ICD-10-CM | POA: Diagnosis not present

## 2015-12-28 DIAGNOSIS — S80212A Abrasion, left knee, initial encounter: Secondary | ICD-10-CM | POA: Insufficient documentation

## 2015-12-28 DIAGNOSIS — Y998 Other external cause status: Secondary | ICD-10-CM | POA: Insufficient documentation

## 2015-12-28 DIAGNOSIS — S79911A Unspecified injury of right hip, initial encounter: Secondary | ICD-10-CM | POA: Insufficient documentation

## 2015-12-28 DIAGNOSIS — Y9289 Other specified places as the place of occurrence of the external cause: Secondary | ICD-10-CM | POA: Diagnosis not present

## 2015-12-28 DIAGNOSIS — F329 Major depressive disorder, single episode, unspecified: Secondary | ICD-10-CM | POA: Insufficient documentation

## 2015-12-28 DIAGNOSIS — F17210 Nicotine dependence, cigarettes, uncomplicated: Secondary | ICD-10-CM | POA: Insufficient documentation

## 2015-12-28 DIAGNOSIS — Z8719 Personal history of other diseases of the digestive system: Secondary | ICD-10-CM | POA: Diagnosis not present

## 2015-12-28 DIAGNOSIS — Z8639 Personal history of other endocrine, nutritional and metabolic disease: Secondary | ICD-10-CM | POA: Diagnosis not present

## 2015-12-28 DIAGNOSIS — J449 Chronic obstructive pulmonary disease, unspecified: Secondary | ICD-10-CM | POA: Diagnosis not present

## 2015-12-28 DIAGNOSIS — Y9301 Activity, walking, marching and hiking: Secondary | ICD-10-CM | POA: Diagnosis not present

## 2015-12-28 DIAGNOSIS — Z79899 Other long term (current) drug therapy: Secondary | ICD-10-CM | POA: Insufficient documentation

## 2015-12-28 DIAGNOSIS — W01198A Fall on same level from slipping, tripping and stumbling with subsequent striking against other object, initial encounter: Secondary | ICD-10-CM | POA: Diagnosis not present

## 2015-12-28 DIAGNOSIS — Z7951 Long term (current) use of inhaled steroids: Secondary | ICD-10-CM | POA: Insufficient documentation

## 2015-12-28 DIAGNOSIS — Z8739 Personal history of other diseases of the musculoskeletal system and connective tissue: Secondary | ICD-10-CM | POA: Diagnosis not present

## 2015-12-28 DIAGNOSIS — M25562 Pain in left knee: Secondary | ICD-10-CM

## 2015-12-28 DIAGNOSIS — W19XXXA Unspecified fall, initial encounter: Secondary | ICD-10-CM

## 2015-12-28 DIAGNOSIS — S8992XA Unspecified injury of left lower leg, initial encounter: Secondary | ICD-10-CM | POA: Diagnosis present

## 2015-12-28 MED ORDER — HYDROCODONE-ACETAMINOPHEN 5-325 MG PO TABS
1.0000 | ORAL_TABLET | Freq: Once | ORAL | Status: AC
  Administered 2015-12-28: 1 via ORAL
  Filled 2015-12-28: qty 1

## 2015-12-28 MED ORDER — KETOROLAC TROMETHAMINE 60 MG/2ML IM SOLN
60.0000 mg | Freq: Once | INTRAMUSCULAR | Status: AC
  Administered 2015-12-28: 60 mg via INTRAMUSCULAR
  Filled 2015-12-28: qty 2

## 2015-12-28 NOTE — ED Notes (Signed)
Patient transported to X-ray 

## 2015-12-28 NOTE — Discharge Instructions (Signed)
Continue home pain medications. Keep your scheduled follow up appointment with orthopedics. Return to ED with new, worsening or concerning symptoms.    Joint Pain Joint pain, which is also called arthralgia, can be caused by many things. Joint pain often goes away when you follow your health care provider's instructions for relieving pain at home. However, joint pain can also be caused by conditions that require further treatment. Common causes of joint pain include:  Bruising in the area of the joint.  Overuse of the joint.  Wear and tear on the joints that occur with aging (osteoarthritis).  Various other forms of arthritis.  A buildup of a crystal form of uric acid in the joint (gout).  Infections of the joint (septic arthritis) or of the bone (osteomyelitis). Your health care provider may recommend medicine to help with the pain. If your joint pain continues, additional tests may be needed to diagnose your condition. HOME CARE INSTRUCTIONS Watch your condition for any changes. Follow these instructions as directed to lessen the pain that you are feeling.  Take medicines only as directed by your health care provider.  Rest the affected area for as long as your health care provider says that you should. If directed to do so, raise the painful joint above the level of your heart while you are sitting or lying down.  Do not do things that cause or worsen pain.  If directed, apply ice to the painful area:  Put ice in a plastic bag.  Place a towel between your skin and the bag.  Leave the ice on for 20 minutes, 2-3 times per day.  Wear an elastic bandage, splint, or sling as directed by your health care provider. Loosen the elastic bandage or splint if your fingers or toes become numb and tingle, or if they turn cold and blue.  Begin exercising or stretching the affected area as directed by your health care provider. Ask your health care provider what types of exercise are safe for  you.  Keep all follow-up visits as directed by your health care provider. This is important. SEEK MEDICAL CARE IF:  Your pain increases, and medicine does not help.  Your joint pain does not improve within 3 days.  You have increased bruising or swelling.  You have a fever.  You lose 10 lb (4.5 kg) or more without trying. SEEK IMMEDIATE MEDICAL CARE IF:  You are not able to move the joint.  Your fingers or toes become numb or they turn cold and blue.   This information is not intended to replace advice given to you by your health care provider. Make sure you discuss any questions you have with your health care provider.   Document Released: 09/08/2005 Document Revised: 09/29/2014 Document Reviewed: 06/20/2014 Elsevier Interactive Patient Education [employer]2016 [employer]

## 2015-12-28 NOTE — ED Provider Notes (Signed)
CSN: [id number]     Arrival date & time 12/28/15  1153 History   First MD Initiated Contact with Patient 12/28/15 1202     Chief Complaint  Patient presents with  . Fall   HPI  50 year old female with past medical history of hypertension, depression, COPD and sciatica presenting with right hip and left knee pain after a fall. Patient states she has to use a walker due to chronic sciatica which causes balance issues. She was attempting to walk to her front door when her walker got caught up and tripped her. She states she fell onto her right side and has right sided hip pain since. She fell again a few days ago and reports striking her left knee on the ground. She is having left-sided knee pain as well. Her right hip pain is described as sharp and hard. This is exacerbated by ambulating and moving her hip. The pain occasionally radiates towards her inner thigh. She states her left knee pain is anterior over the site of an abrasion. The left knee pain does not radiate. The knee pain is exacerbated by ambulating. She went to her pain management physician for these complaints. Her oxycodone was increased to 15 mg. She denies new trauma or injury since visiting her pain clinic. She is still able to ambulate using her walker. She denies bowel or bladder incontinence, numbness of the extremities or weakness in the extremities. She has a scheduled follow-up appointment with orthopedics arranged by her pain management clinic in the next week and a half. She has no other complaints today.  Chart review: patient managed by Dr. Tyler Deis at a pain management clinic. He has referred her to an orthopedic doctor for a visit in 10 days.   Past Medical History  Diagnosis Date  . Hyperlipemia   . Depression   . High cholesterol   . Thyroid disease   . COPD (chronic obstructive pulmonary disease) (HCC)   . Sciatica   . Bulging lumbar disc   . Gall stones   . Hypertension    Past Surgical History  Procedure  Laterality Date  . Tubal ligation    . Knee surgery    . Hernia repair     No family history on file. Social History  Substance Use Topics  . Smoking status: Current Every Day Smoker -- 0.50 packs/day    Types: Cigarettes  . Smokeless tobacco: Never Used  . Alcohol Use: 0.5 oz/week    1 Standard drinks or equivalent per week     Comment: RARE   OB History    No data available     Review of Systems  All other systems reviewed and are negative.     Allergies  Asa; Bactrim; Flagyl; and Sulfa antibiotics  Home Medications   Prior to Admission medications   Medication Sig Start Date End Date Taking? Authorizing Provider  baclofen (LIORESAL) 10 MG tablet Take 10 mg by mouth 3 (three) times daily.    Historical Provider, MD  DULoxetine (CYMBALTA) 20 MG capsule Take 20 mg by mouth daily.    Historical Provider, MD  DULoxetine (CYMBALTA) 30 MG capsule Take 30 mg by mouth daily.    Historical Provider, MD  esomeprazole (NEXIUM) 40 MG capsule Take 40 mg by mouth daily at 12 noon.    Historical Provider, MD  fluticasone-salmeterol (ADVAIR HFA) 115-21 MCG/ACT inhaler Inhale 2 puffs into the lungs 2 (two) times daily.    Historical Provider, MD  ibuprofen (ADVIL,MOTRIN) 800 MG  tablet Take 1 tablet (800 mg total) by mouth every 8 (eight) hours as needed for mild pain. 10/19/14   Kristen N Ward, DO  memantine (NAMENDA) 5 MG tablet Take 5 mg by mouth 2 (two) times daily.    Historical Provider, MD  metoprolol (LOPRESSOR) 50 MG tablet Take 50 mg by mouth 2 (two) times daily.    Historical Provider, MD  oxyCODONE-acetaminophen (PERCOCET/ROXICET) 5-325 MG per tablet Take 1-2 tablets by mouth every 6 (six) hours as needed for severe pain. 05/02/14   Jerelyn Scott, MD  Pregabalin (LYRICA PO) Take by mouth.    Historical Provider, MD  promethazine (PHENERGAN) 25 MG tablet Take 25 mg by mouth every 6 (six) hours as needed for nausea or vomiting.    Historical Provider, MD  venlafaxine (EFFEXOR) 37.5  MG tablet Take 37.5 mg by mouth 2 (two) times daily.    Historical Provider, MD   BP 153/90 mmHg  Pulse 82  Temp(Src) 98.1 F (36.7 C) (Oral)  Resp 18  Ht  (1.626 m)  Wt 84.369 kg  BMI 31.91 kg/m2  SpO2 100% Physical Exam  Constitutional: She appears well-developed and well-nourished. No distress.  HENT:  Head: Normocephalic and atraumatic.  Right Ear: External ear normal.  Left Ear: External ear normal.  Eyes: Conjunctivae are normal. Right eye exhibits no discharge. Left eye exhibits no discharge. No scleral icterus.  Neck: Normal range of motion.  Cardiovascular: Normal rate and intact distal pulses.   Pulmonary/Chest: Effort normal.  Musculoskeletal: Normal range of motion.       Right hip: She exhibits tenderness. She exhibits normal range of motion, normal strength and no deformity.       Left knee: She exhibits normal range of motion, no effusion, no ecchymosis and no deformity. Tenderness found.       Legs: Tenderness over the right buttock and greater trochanter. No obvious swelling or deformity. Patient moves the right leg freely while playing on the stretcher. Full range of motion intact at the right hip, knee and ankle. Tenderness to palpation of the anterior left patella. Small overlying abrasion without active bleeding. Full range of motion of the left knee intact. No effusion or deformity. No ligamentous laxity.  Neurological: She is alert. Coordination normal.  5/5 strength on testing of the bilateral lower extremities. Sensation to light touch intact throughout. Patient is able to ambulate unassisted  Skin: Skin is warm and dry.  Psychiatric: She has a normal mood and affect. Her behavior is normal.  Nursing note and vitals reviewed.   ED Course  Procedures (including critical care time) Labs Review Labs Reviewed - No data to display  Imaging Review Dg Knee Complete 4 Views Left  12/28/2015  CLINICAL DATA:  LEFT knee pain after falling 2 weeks ago,  multiple abrasions anterior LEFT knee, pain with ambulation, RIGHT hip pain EXAM: LEFT KNEE - COMPLETE 4+ VIEW COMPARISON:  None. FINDINGS: Medial compartment joint space narrowing. Remaining joint spaces preserved. No acute fracture, dislocation or bone destruction. Soft tissues unremarkable. No joint effusion. IMPRESSION: Degenerative changes without acute bony abnormality. Electronically Signed   By: Ulyses Southward M.D.   On: 12/28/2015 13:15   Dg Hip Unilat With Pelvis 2-3 Views Right  12/28/2015  CLINICAL DATA:  RIGHT hip pain after falling 2 weeks ago, pain with ambulation and rotation, pain radiating to RIGHT buttock area EXAM: DG HIP (WITH OR WITHOUT PELVIS) 2-3V RIGHT COMPARISON:  12/09/2015 FINDINGS: Bones appear demineralized. SI joints symmetric and  preserved. LEFT hip joint appears preserved with minimal spur formation. RIGHT hip joint space narrowing and spur formation. No acute fracture, dislocation, or bone destruction. Soft tissues unremarkable. IMPRESSION: Osseous demineralization with degenerative changes RIGHT hip joint. No definite acute bony abnormalities. If patient has persistent symptoms, consider MR. Electronically Signed   By: Ulyses SouthwardMark  Boles M.D.   On: 12/28/2015 13:14   I have personally reviewed and evaluated these images and lab results as part of my medical decision-making.   EKG Interpretation None      MDM   Final diagnoses:  Fall, initial encounter  Right hip pain  Left knee pain   50 year old female presenting with right hip pain and left knee pain after mechanical falls. Afebrile and non-toxic appearing. Right lower extremity is neurovascularly intact With full range of motion. Tenderness over the right greater trochanter and medial buttock. Hip deformity. Left lower extremity is neurovascularly intact with full range of motion. Tenderness over anterior patella. No effusion or deformity noted. Patient is able to ambulate unassisted. X-rays of hip and knee without acute  injury. Patient requesting Toradol which was given prior to discharge. She has oxycodone at home for pain control and is followed by a pain management clinic so will not provide prescription for pain medication today. She has a follow-up plan with an orthopedic doctor in the next few weeks. I discussed other conservative pain control measures including ice, heat and over-the-counter NSAIDs. She has a walker and cane at home for comfort. Return precautions discussed at bedside and given in discharge paperwork. Pt is stable for discharge.    Rolm GalaStevi Yolanda Huffstetler, PA-C 12/28/15 1408  Azalia BilisKevin Campos, MD 12/28/15 1426

## 2015-12-28 NOTE — ED Notes (Signed)
Pt placed in exam gown, noticed several small abrasions on both pt's knees, she states they are also from same fall  2 weeks ago and then also this past weekend she fell again. Friend at bedside.

## 2015-12-28 NOTE — ED Notes (Signed)
Pt. Ambulated to rest room.  Pt. Compliant of pain in the R hip and knees. Pt. Asking for a toradol injection.

## 2015-12-28 NOTE — ED Notes (Signed)
PA at bedside.

## 2015-12-28 NOTE — ED Notes (Signed)
States she fell 2 weeks ago. Pain in her right hip since the fall.

## 2017-02-14 ENCOUNTER — Emergency Department (HOSPITAL_BASED_OUTPATIENT_CLINIC_OR_DEPARTMENT_OTHER)
Admission: EM | Admit: 2017-02-14 | Discharge: 2017-02-14 | Disposition: A | Discharge status: Home / Self Care | Payer: Medicaid Other | Attending: Emergency Medicine | Admitting: Emergency Medicine

## 2017-02-14 ENCOUNTER — Encounter (HOSPITAL_BASED_OUTPATIENT_CLINIC_OR_DEPARTMENT_OTHER): Payer: Self-pay | Admitting: Emergency Medicine

## 2017-02-14 DIAGNOSIS — I10 Essential (primary) hypertension: Secondary | ICD-10-CM | POA: Insufficient documentation

## 2017-02-14 DIAGNOSIS — J449 Chronic obstructive pulmonary disease, unspecified: Secondary | ICD-10-CM | POA: Insufficient documentation

## 2017-02-14 DIAGNOSIS — E119 Type 2 diabetes mellitus without complications: Secondary | ICD-10-CM | POA: Insufficient documentation

## 2017-02-14 DIAGNOSIS — Z7984 Long term (current) use of oral hypoglycemic drugs: Secondary | ICD-10-CM | POA: Insufficient documentation

## 2017-02-14 DIAGNOSIS — M79605 Pain in left leg: Secondary | ICD-10-CM | POA: Insufficient documentation

## 2017-02-14 DIAGNOSIS — Z7902 Long term (current) use of antithrombotics/antiplatelets: Secondary | ICD-10-CM | POA: Diagnosis not present

## 2017-02-14 DIAGNOSIS — F17210 Nicotine dependence, cigarettes, uncomplicated: Secondary | ICD-10-CM | POA: Insufficient documentation

## 2017-02-14 HISTORY — DX: Type 2 diabetes mellitus without complications: E11.9

## 2017-02-14 LAB — CBC WITH DIFFERENTIAL/PLATELET
BASOS ABS: 0 10*3/uL (ref 0.0–0.1)
Basophils Relative: 0 %
EOS PCT: 2 %
Eosinophils Absolute: 0.2 10*3/uL (ref 0.0–0.7)
HEMATOCRIT: 38.2 % (ref 36.0–46.0)
Hemoglobin: 12.9 g/dL (ref 12.0–15.0)
LYMPHS PCT: 30 %
Lymphs Abs: 2.9 10*3/uL (ref 0.7–4.0)
MCH: 28.3 pg (ref 26.0–34.0)
MCHC: 33.8 g/dL (ref 30.0–36.0)
MCV: 83.8 fL (ref 78.0–100.0)
MONO ABS: 0.9 10*3/uL (ref 0.1–1.0)
MONOS PCT: 9 %
Neutro Abs: 5.7 10*3/uL (ref 1.7–7.7)
Neutrophils Relative %: 59 %
PLATELETS: 280 10*3/uL (ref 150–400)
RBC: 4.56 MIL/uL (ref 3.87–5.11)
RDW: 16 % — AB (ref 11.5–15.5)
WBC: 9.7 10*3/uL (ref 4.0–10.5)

## 2017-02-14 LAB — BASIC METABOLIC PANEL
Anion gap: 10 (ref 5–15)
BUN: 15 mg/dL (ref 6–20)
CALCIUM: 10.7 mg/dL — AB (ref 8.9–10.3)
CO2: 27 mmol/L (ref 22–32)
Chloride: 103 mmol/L (ref 101–111)
Creatinine, Ser: 0.79 mg/dL (ref 0.44–1.00)
GFR calc Af Amer: >60 mL/min (ref >60)
Glucose, Bld: 90 mg/dL (ref 65–99)
Potassium: 3.1 mmol/L — ABNORMAL LOW (ref 3.5–5.1)
Sodium: 140 mmol/L (ref 135–145)

## 2017-02-14 LAB — D-DIMER, QUANTITATIVE (NOT AT ARMC): D DIMER QUANT: 0.44 ug{FEU}/mL (ref 0.00–0.50)

## 2017-02-14 MED ORDER — POTASSIUM CHLORIDE CRYS ER 20 MEQ PO TBCR
20.0000 meq | EXTENDED_RELEASE_TABLET | Freq: Once | ORAL | Status: AC
  Administered 2017-02-14: 20 meq via ORAL
  Filled 2017-02-14: qty 1

## 2017-02-14 MED ORDER — MORPHINE SULFATE (PF) 4 MG/ML IV SOLN
4.0000 mg | Freq: Once | INTRAVENOUS | Status: AC
  Administered 2017-02-14: 4 mg via INTRAVENOUS
  Filled 2017-02-14: qty 1

## 2017-02-14 MED ORDER — KETOROLAC TROMETHAMINE 60 MG/2ML IM SOLN
60.0000 mg | Freq: Once | INTRAMUSCULAR | Status: AC
  Administered 2017-02-14: 60 mg via INTRAMUSCULAR
  Filled 2017-02-14: qty 2

## 2017-02-14 MED ORDER — DICLOFENAC SODIUM 1 % TD GEL: 2.0000 g | Freq: Four times a day (QID) | TRANSDERMAL | 0 refills | Status: DC | PRN

## 2017-02-14 MED ORDER — LIDOCAINE 5 % EX PTCH: 1.0000 | MEDICATED_PATCH | CUTANEOUS | 0 refills | Status: DC

## 2017-02-14 NOTE — ED Notes (Signed)
Pt taken to and from [facility] via [facility]. Pt states that pain medication given has not worked for her pain. RN made aware.

## 2017-02-14 NOTE — ED Triage Notes (Signed)
Bilateral leg pain x 2-3 weeks, denies injury.

## 2017-02-14 NOTE — ED Notes (Signed)
ED Provider at bedside. 

## 2017-02-14 NOTE — ED Provider Notes (Signed)
[facility]   CSN: [id number] Arrival date & time: 02/14/17  0706     History   Chief Complaint Chief Complaint  Patient presents with  . Leg Pain    HPI Judy Schmidt is a 51 y.o. female.  The history is provided by the patient. No language interpreter was used.  Leg Pain     Judy Schmidt is a 51 y.o. female who presents to the Emergency Department complaining of leg pain.  She presents for evaluation of left leg pain that has been present for the last 2-3 weeks. Pain is severe and constant in nature. Pain is located from the left hip radiating down to the left ankle. She describes it as diffuse and stabbing pain. She denies any fevers, injuries, back pain, nausea, vomiting, abdominal pain. She has a history of diabetes and does not take pain medication at home. Pain does not improve or worsen with laying down or ambulation. Pain is so bad she is having difficulty sleeping at night. She has some mild pain in the right leg diffusely but much less severe than the left. Past Medical History:  Diagnosis Date  . Bulging lumbar disc   . COPD (chronic obstructive pulmonary disease) (HCC)   . Depression   . Diabetes mellitus without complication (HCC)   . Gall stones   . High cholesterol   . Hyperlipemia   . Hypertension   . Sciatica   . Thyroid disease     There are no active problems to display for this patient.   Past Surgical History:  Procedure Laterality Date  . HERNIA REPAIR    . KNEE SURGERY    . TUBAL LIGATION      OB History    No data available       Home Medications    Prior to Admission medications   Medication Sig Start Date End Date Taking? Authorizing Provider  amLODipine (NORVASC) 10 MG tablet Take 10 mg by mouth daily.   Yes [provider]  baclofen (LIORESAL) 10 MG tablet Take 10 mg by mouth 3 (three) times daily.   Yes [provider]  DULoxetine (CYMBALTA) 30 MG capsule Take 30 mg by mouth daily.    Yes [provider]  esomeprazole (NEXIUM) 40 MG capsule Take 40 mg by mouth daily at 12 noon.   Yes [provider]  memantine (NAMENDA) 5 MG tablet Take 5 mg by mouth 2 (two) times daily.   Yes [provider]  metFORMIN (GLUCOPHAGE) 500 MG tablet Take by mouth 2 (two) times daily with a meal.   Yes [provider]  metoprolol (LOPRESSOR) 50 MG tablet Take 50 mg by mouth 2 (two) times daily.   Yes [provider]  pravastatin (PRAVACHOL) 10 MG tablet Take 10 mg by mouth daily.   Yes [provider]  Pregabalin (LYRICA PO) Take by mouth.   Yes [provider]  diclofenac sodium (VOLTAREN) 1 % GEL Apply 2 g topically 4 (four) times daily as needed. 02/14/17   Tilden Fossa, MD  DULoxetine (CYMBALTA) 20 MG capsule Take 20 mg by mouth daily.    [provider]  fluticasone-salmeterol (ADVAIR HFA) 115-21 MCG/ACT inhaler Inhale 2 puffs into the lungs 2 (two) times daily.    [provider]  ibuprofen (ADVIL,MOTRIN) 800 MG tablet Take 1 tablet (800 mg total) by mouth every 8 (eight) hours as needed for mild pain. 10/19/14   Ward, Layla Maw, DO  lidocaine (  LIDODERM) 5 % Place 1 patch onto the skin daily. Remove & Discard patch within 12 hours or as directed by MD 02/14/17   Tilden Fossa, MD  oxyCODONE-acetaminophen (PERCOCET/ROXICET) 5-325 MG per tablet Take 1-2 tablets by mouth every 6 (six) hours as needed for severe pain. 05/02/14   Jerelyn Scott, MD  promethazine (PHENERGAN) 25 MG tablet Take 25 mg by mouth every 6 (six) hours as needed for nausea or vomiting.    [provider]  venlafaxine (EFFEXOR) 37.5 MG tablet Take 37.5 mg by mouth 2 (two) times daily.    [provider]    Family History No family history on file.  Social History Social History  Substance Use Topics  . Smoking status: Current Every Day Smoker    Packs/day: 0.50    Types: Cigarettes  . Smokeless tobacco: Never Used  .  Alcohol use 0.5 oz/week    1 Standard drinks or equivalent per week     Comment: RARE     Allergies   Asa [aspirin]; Bactrim [sulfamethoxazole-trimethoprim]; Flagyl [metronidazole]; Plavix [clopidogrel bisulfate]; and Sulfa antibiotics   Review of Systems Review of Systems  All other systems reviewed and are negative.    Physical Exam Updated Vital Signs BP 127/89 (BP Location: Right Arm)   Pulse 86   Temp 98.6 F (37 C) (Oral)   Resp 18   Ht 5\' 4"  (1.626 m)   Wt 68 kg (150 lb)   SpO2 97%   BMI 25.75 kg/m   Physical Exam  Constitutional: She is oriented to person, place, and time. She appears well-developed and well-nourished.  Uncomfortable appearing  HENT:  Head: Normocephalic and atraumatic.  Cardiovascular: Normal rate and regular rhythm.   No murmur heard. Pulmonary/Chest: Effort normal and breath sounds normal. No respiratory distress.  Abdominal: Soft. There is no tenderness. There is no rebound and no guarding.  Musculoskeletal:  2+ DP and femoral pulses bilaterally. There is diffuse tenderness to palpation throughout the thigh, knee, calf on the left lower extremity with no appreciable edema or deformity. There is pain with active and passive range of motion of the left hip and knee. There is mild tenderness to palpation over the left buttock. There is no lumbar tenderness to palpation. Antalgic gait.  Neurological: She is alert and oriented to person, place, and time.  5/5 strength in BLE, sensation to light touch intact in BLE.    Skin: Skin is warm and dry.  Psychiatric: She has a normal mood and affect. Her behavior is normal.  Nursing note and vitals reviewed.    ED Treatments / Results  Labs (all labs ordered are listed, but only abnormal results are displayed) Labs Reviewed  BASIC METABOLIC PANEL - Abnormal; Notable for the following:       Result Value   Potassium 3.1 (*)    Calcium 10.7 (*)    All other components within normal limits  CBC  WITH DIFFERENTIAL/PLATELET - Abnormal; Notable for the following:    RDW 16.0 (*)    All other components within normal limits  D-DIMER, QUANTITATIVE (NOT AT [facility])    EKG  EKG Interpretation None       Radiology No results found.  Procedures Procedures (including critical care time)  Medications Ordered in ED Medications  morphine 4 MG/ML injection 4 mg (not administered)  ketorolac (TORADOL) injection 60 mg (60 mg Intramuscular Given 02/14/17 0809)  potassium chloride SA (K-DUR,KLOR-CON) CR tablet 20 mEq (20 mEq Oral Given 02/14/17 0856)  Initial Impression / Assessment and Plan / ED Course  I have reviewed the triage vital signs and the nursing notes.  Pertinent labs & imaging results that were available during my care of the patient were reviewed by me and considered in my medical decision making (see chart for details).    Records reviewed in care everywhere. She has had an abdominal ultrasound in the last two years that was negative for aneurysm.   Patient here for evaluation of atraumatic left lower extremity pain over the last 2-3 weeks. She is neurovascularly intact on examination with no evidence of acute infection. Presentation is not consistent with DVT, she is low risk and d-dimer is negative. No evidence of acute infectious process. Presentation is not consistent with acute spinal cord compression, compartment syndrome, rhabdo. 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 She denies any IV drug abuse. Providing one-time dose of pain medications for her pain but discuss that there is risk for abuse with narcotic pain medications. Will not discharge home with any narcotics given her history. Counseled patient on home care for leg pain, unclear etiology. Discussed importance of outpatient follow-up and return precautions.  Final Clinical Impressions(s) / ED Diagnoses   Final diagnoses:  Left leg pain    New  Prescriptions New Prescriptions   DICLOFENAC SODIUM (VOLTAREN) 1 % GEL    Apply 2 g topically 4 (four) times daily as needed.   LIDOCAINE (LIDODERM) 5 %    Place 1 patch onto the skin daily. Remove & Discard patch within 12 hours or as directed by MD     Tilden Fossaees, Gurleen Larrivee, MD 02/14/17 [phone]

## 2017-02-17 ENCOUNTER — Encounter (HOSPITAL_COMMUNITY): Payer: Self-pay | Admitting: *Deleted

## 2017-02-17 ENCOUNTER — Encounter (HOSPITAL_COMMUNITY): Payer: Self-pay | Admitting: Emergency Medicine

## 2017-02-17 ENCOUNTER — Inpatient Hospital Stay (HOSPITAL_COMMUNITY)
Admission: RE | Admit: 2017-02-17 | Discharge: 2017-02-19 | DRG: 881 | Disposition: A | Discharge status: Home / Self Care | Payer: Medicaid Other | Attending: Psychiatry | Admitting: Psychiatry

## 2017-02-17 ENCOUNTER — Emergency Department (HOSPITAL_COMMUNITY)
Admission: EM | Admit: 2017-02-17 | Discharge: 2017-02-17 | Disposition: A | Discharge status: Home / Self Care | Payer: Medicaid Other | Attending: Emergency Medicine | Admitting: Emergency Medicine

## 2017-02-17 DIAGNOSIS — Z7984 Long term (current) use of oral hypoglycemic drugs: Secondary | ICD-10-CM | POA: Diagnosis not present

## 2017-02-17 DIAGNOSIS — F333 Major depressive disorder, recurrent, severe with psychotic symptoms: Secondary | ICD-10-CM | POA: Diagnosis not present

## 2017-02-17 DIAGNOSIS — I10 Essential (primary) hypertension: Secondary | ICD-10-CM | POA: Insufficient documentation

## 2017-02-17 DIAGNOSIS — M5430 Sciatica, unspecified side: Secondary | ICD-10-CM | POA: Diagnosis present

## 2017-02-17 DIAGNOSIS — F1414 Cocaine abuse with cocaine-induced mood disorder: Secondary | ICD-10-CM

## 2017-02-17 DIAGNOSIS — F17210 Nicotine dependence, cigarettes, uncomplicated: Secondary | ICD-10-CM | POA: Diagnosis present

## 2017-02-17 DIAGNOSIS — E785 Hyperlipidemia, unspecified: Secondary | ICD-10-CM | POA: Diagnosis present

## 2017-02-17 DIAGNOSIS — Z79899 Other long term (current) drug therapy: Secondary | ICD-10-CM | POA: Diagnosis not present

## 2017-02-17 DIAGNOSIS — J449 Chronic obstructive pulmonary disease, unspecified: Secondary | ICD-10-CM | POA: Diagnosis present

## 2017-02-17 DIAGNOSIS — F1424 Cocaine dependence with cocaine-induced mood disorder: Secondary | ICD-10-CM | POA: Diagnosis present

## 2017-02-17 DIAGNOSIS — F329 Major depressive disorder, single episode, unspecified: Secondary | ICD-10-CM | POA: Diagnosis present

## 2017-02-17 DIAGNOSIS — E119 Type 2 diabetes mellitus without complications: Secondary | ICD-10-CM | POA: Diagnosis present

## 2017-02-17 DIAGNOSIS — M79605 Pain in left leg: Secondary | ICD-10-CM | POA: Diagnosis present

## 2017-02-17 DIAGNOSIS — K219 Gastro-esophageal reflux disease without esophagitis: Secondary | ICD-10-CM | POA: Diagnosis present

## 2017-02-17 DIAGNOSIS — R45851 Suicidal ideations: Secondary | ICD-10-CM | POA: Diagnosis not present

## 2017-02-17 DIAGNOSIS — M5432 Sciatica, left side: Secondary | ICD-10-CM | POA: Diagnosis not present

## 2017-02-17 DIAGNOSIS — E7801 Familial hypercholesterolemia: Secondary | ICD-10-CM | POA: Diagnosis present

## 2017-02-17 LAB — COMPREHENSIVE METABOLIC PANEL
ALBUMIN: 4.5 g/dL (ref 3.5–5.0)
ALT: 14 U/L (ref 14–54)
AST: 19 U/L (ref 15–41)
Alkaline Phosphatase: 72 U/L (ref 38–126)
Anion gap: 8 (ref 5–15)
BUN: 13 mg/dL (ref 6–20)
CHLORIDE: 106 mmol/L (ref 101–111)
CO2: 27 mmol/L (ref 22–32)
Calcium: 10 mg/dL (ref 8.9–10.3)
Creatinine, Ser: 0.73 mg/dL (ref 0.44–1.00)
GFR calc Af Amer: >60 mL/min (ref >60)
Glucose, Bld: 97 mg/dL (ref 65–99)
POTASSIUM: 3.6 mmol/L (ref 3.5–5.1)
SODIUM: 141 mmol/L (ref 135–145)
Total Bilirubin: 0.6 mg/dL (ref 0.3–1.2)
Total Protein: 8.4 g/dL — ABNORMAL HIGH (ref 6.5–8.1)

## 2017-02-17 LAB — ETHANOL

## 2017-02-17 LAB — CBC
HCT: 40.9 % (ref 36.0–46.0)
Hemoglobin: 13.7 g/dL (ref 12.0–15.0)
MCH: 28.5 pg (ref 26.0–34.0)
MCHC: 33.5 g/dL (ref 30.0–36.0)
MCV: 85 fL (ref 78.0–100.0)
PLATELETS: 292 10*3/uL (ref 150–400)
RBC: 4.81 MIL/uL (ref 3.87–5.11)
RDW: 15.7 % — ABNORMAL HIGH (ref 11.5–15.5)
WBC: 10 10*3/uL (ref 4.0–10.5)

## 2017-02-17 LAB — RAPID URINE DRUG SCREEN, HOSP PERFORMED
AMPHETAMINES: NOT DETECTED
BENZODIAZEPINES: NOT DETECTED
Barbiturates: NOT DETECTED
Cocaine: POSITIVE — AB
OPIATES: NOT DETECTED
Tetrahydrocannabinol: NOT DETECTED

## 2017-02-17 LAB — ACETAMINOPHEN LEVEL: Acetaminophen (Tylenol), Serum: <10 ug/mL — ABNORMAL LOW (ref 10–30)

## 2017-02-17 LAB — CBG MONITORING, ED: Glucose-Capillary: 146 mg/dL — ABNORMAL HIGH (ref 65–99)

## 2017-02-17 LAB — SALICYLATE LEVEL

## 2017-02-17 MED ORDER — MAGNESIUM HYDROXIDE 400 MG/5ML PO SUSP: 30.0000 mL | Freq: Every day | ORAL | Status: DC | PRN

## 2017-02-17 MED ORDER — KETOROLAC TROMETHAMINE 60 MG/2ML IM SOLN
30.0000 mg | Freq: Once | INTRAMUSCULAR | Status: AC
  Administered 2017-02-17: 30 mg via INTRAMUSCULAR
  Filled 2017-02-17 (×2): qty 2

## 2017-02-17 MED ORDER — BACLOFEN 10 MG PO TABS
10.0000 mg | ORAL_TABLET | Freq: Three times a day (TID) | ORAL | Status: DC
  Administered 2017-02-18 – 2017-02-19 (×5): 10 mg via ORAL
  Filled 2017-02-17 (×10): qty 1

## 2017-02-17 MED ORDER — ACETAMINOPHEN 325 MG PO TABS
650.0000 mg | ORAL_TABLET | Freq: Four times a day (QID) | ORAL | Status: DC | PRN
  Administered 2017-02-18 – 2017-02-19 (×2): 650 mg via ORAL
  Filled 2017-02-17 (×2): qty 2

## 2017-02-17 MED ORDER — DULOXETINE HCL 30 MG PO CPEP
30.0000 mg | ORAL_CAPSULE | Freq: Every day | ORAL | Status: DC
  Administered 2017-02-18: 30 mg via ORAL
  Filled 2017-02-17 (×3): qty 1

## 2017-02-17 MED ORDER — HYDROXYZINE HCL 25 MG PO TABS
25.0000 mg | ORAL_TABLET | Freq: Three times a day (TID) | ORAL | Status: DC | PRN
  Administered 2017-02-17 – 2017-02-18 (×2): 25 mg via ORAL
  Filled 2017-02-17 (×2): qty 1

## 2017-02-17 MED ORDER — METHYLPREDNISOLONE 4 MG PO TBPK: ORAL_TABLET | ORAL | 0 refills | Status: DC

## 2017-02-17 MED ORDER — DULOXETINE HCL 20 MG PO CPEP: 20.0000 mg | ORAL_CAPSULE | Freq: Every day | ORAL | Status: DC

## 2017-02-17 MED ORDER — TRAMADOL HCL 50 MG PO TABS
50.0000 mg | ORAL_TABLET | Freq: Once | ORAL | Status: AC
  Administered 2017-02-17: 50 mg via ORAL
  Filled 2017-02-17: qty 1

## 2017-02-17 MED ORDER — METOPROLOL TARTRATE 50 MG PO TABS
50.0000 mg | ORAL_TABLET | Freq: Two times a day (BID) | ORAL | Status: DC
  Administered 2017-02-19: 50 mg via ORAL
  Filled 2017-02-17 (×8): qty 1

## 2017-02-17 MED ORDER — PANTOPRAZOLE SODIUM 40 MG PO TBEC
40.0000 mg | DELAYED_RELEASE_TABLET | Freq: Every day | ORAL | Status: DC
  Administered 2017-02-18 – 2017-02-19 (×2): 40 mg via ORAL
  Filled 2017-02-17 (×4): qty 1

## 2017-02-17 MED ORDER — DICLOFENAC SODIUM 1 % TD GEL: 2.0000 g | Freq: Four times a day (QID) | TRANSDERMAL | Status: DC

## 2017-02-17 MED ORDER — MOMETASONE FURO-FORMOTEROL FUM 200-5 MCG/ACT IN AERO
2.0000 | INHALATION_SPRAY | Freq: Two times a day (BID) | RESPIRATORY_TRACT | Status: DC
  Filled 2017-02-17: qty 8.8

## 2017-02-17 MED ORDER — MEMANTINE HCL 5 MG PO TABS
5.0000 mg | ORAL_TABLET | Freq: Two times a day (BID) | ORAL | Status: DC
  Administered 2017-02-18 – 2017-02-19 (×3): 5 mg via ORAL
  Filled 2017-02-17 (×8): qty 1

## 2017-02-17 MED ORDER — ALUM & MAG HYDROXIDE-SIMETH 200-200-20 MG/5ML PO SUSP: 30.0000 mL | ORAL | Status: DC | PRN

## 2017-02-17 MED ORDER — METFORMIN HCL 500 MG PO TABS
500.0000 mg | ORAL_TABLET | Freq: Two times a day (BID) | ORAL | Status: DC
  Administered 2017-02-18 – 2017-02-19 (×3): 500 mg via ORAL
  Filled 2017-02-17 (×7): qty 1

## 2017-02-17 MED ORDER — PNEUMOCOCCAL VAC POLYVALENT 25 MCG/0.5ML IJ INJ
0.5000 mL | INJECTION | INTRAMUSCULAR | Status: AC
  Administered 2017-02-18: 0.5 mL via INTRAMUSCULAR

## 2017-02-17 MED ORDER — TRAZODONE HCL 50 MG PO TABS
50.0000 mg | ORAL_TABLET | Freq: Every evening | ORAL | Status: DC | PRN
  Administered 2017-02-17 – 2017-02-18 (×2): 50 mg via ORAL
  Filled 2017-02-17 (×2): qty 1

## 2017-02-17 MED ORDER — PRAVASTATIN SODIUM 10 MG PO TABS
10.0000 mg | ORAL_TABLET | Freq: Every day | ORAL | Status: DC
  Administered 2017-02-18 – 2017-02-19 (×2): 10 mg via ORAL
  Filled 2017-02-17 (×4): qty 1

## 2017-02-17 MED ORDER — LIDOCAINE 5 % EX PTCH
1.0000 | MEDICATED_PATCH | CUTANEOUS | Status: DC
  Administered 2017-02-17 – 2017-02-18 (×2): 1 via TRANSDERMAL
  Filled 2017-02-17 (×5): qty 1

## 2017-02-17 MED ORDER — NICOTINE 21 MG/24HR TD PT24
21.0000 mg | MEDICATED_PATCH | Freq: Every day | TRANSDERMAL | Status: DC
  Administered 2017-02-18 – 2017-02-19 (×2): 21 mg via TRANSDERMAL
  Filled 2017-02-17 (×4): qty 1

## 2017-02-17 MED ORDER — AMLODIPINE BESYLATE 10 MG PO TABS
10.0000 mg | ORAL_TABLET | Freq: Every day | ORAL | Status: DC
  Administered 2017-02-18 – 2017-02-19 (×2): 10 mg via ORAL
  Filled 2017-02-17 (×4): qty 1

## 2017-02-17 NOTE — Discharge Instructions (Signed)
Take Medrol Dosepak as directed. Track blood sugars while on steroids. Follow-up with PCP for further evaluation. Return to ED for worsening pain, trouble walking, numbness, weakness, injury, trouble breathing.
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
## 2017-02-17 NOTE — ED Provider Notes (Signed)
[facility]-EMERGENCY DEPT Provider Note   CSN: [id number] Arrival date & time: 02/17/17  1047     History   Chief Complaint Chief Complaint  Patient presents with  . Medical Clearance  . Leg Pain    HPI Judy Schmidt is a 51 y.o. female.  HPI  Patient presents with left leg pain that began approximately 2-3 weeks ago. States that the pain is constant and does not change with movement. She states that the pain originates in her left buttock and shoots all the way down to her left foot. She describes it as sharp. Denies any injury or accident that could've started the pain. Denies numbness, weakness, inability to walk, back pain, abdominal pain, fevers. Patient admits to recent crack cocaine use. Patient is currently being [facility] by [facility] and was sent here for medical clearance. Of note, patient was seen approximately 3 days ago for similar complaints of left leg pain. She was given a Voltaren gel and Lidoderm patches but she states that neither of these have worked. States that the only thing that works for her was her oxycodone.  Past Medical History:  Diagnosis Date  . Bulging lumbar disc   . COPD (chronic obstructive pulmonary disease) (HCC)   . Depression   . Diabetes mellitus without complication (HCC)   . Gall stones   . High cholesterol   . Hyperlipemia   . Hypertension   . Sciatica   . Thyroid disease     There are no active problems to display for this patient.   Past Surgical History:  Procedure Laterality Date  . HERNIA REPAIR    . KNEE SURGERY    . TUBAL LIGATION      OB History    No data available       Home Medications    Prior to Admission medications   Medication Sig Start Date End Date Taking? Authorizing Provider  amLODipine (NORVASC) 10 MG tablet Take 10 mg by mouth daily.   Yes [provider]  baclofen (LIORESAL) 10 MG tablet Take 10 mg by mouth 3 (three) times daily.   Yes [provider]  diclofenac sodium (VOLTAREN) 1 % GEL  Apply 2 g topically 4 (four) times daily as needed. 02/14/17  Yes Tilden Fossaees, Elizabeth, MD  DULoxetine (CYMBALTA) 20 MG capsule Take 20 mg by mouth daily.   Yes [provider]  DULoxetine (CYMBALTA) 30 MG capsule Take 30 mg by mouth daily.   Yes [provider]  esomeprazole (NEXIUM) 40 MG capsule Take 40 mg by mouth daily at 12 noon.   Yes [provider]  fluticasone-salmeterol (ADVAIR HFA) 115-21 MCG/ACT inhaler Inhale 2 puffs into the lungs 2 (two) times daily.   Yes [provider]  lidocaine (LIDODERM) 5 % Place 1 patch onto the skin daily. Remove & Discard patch within 12 hours or as directed by MD 02/14/17  Yes Tilden Fossaees, Elizabeth, MD  memantine [facility](NAMENDA) 5 MG tablet Take 5 mg by mouth 2 (two) times daily.   Yes [provider]  metFORMIN (GLUCOPHAGE) 500 MG tablet Take by mouth 2 (two) times daily with a meal.   Yes [provider]  metoprolol (LOPRESSOR) 50 MG tablet Take 50 mg by mouth 2 (two) times daily.   Yes [provider]  Pregabalin (LYRICA PO) Take by mouth.   Yes [provider]  promethazine (PHENERGAN) 25 MG tablet Take 25 mg by mouth every 6 (six) hours as needed for nausea or vomiting.  Yes [provider]  venlafaxine (EFFEXOR) 37.5 MG tablet Take 37.5 mg by mouth 2 (two) times daily.   Yes [provider]  ibuprofen (ADVIL,MOTRIN) 800 MG tablet Take 1 tablet (800 mg total) by mouth every 8 (eight) hours as needed for mild pain. Patient not taking: Reported on 02/17/2017 10/19/14   Ward, Layla Maw, DO  methylPREDNISolone (MEDROL DOSEPAK) 4 MG TBPK tablet Taper over 6 days. 02/17/17   Oniel Meleski, PA-C  oxyCODONE-acetaminophen (PERCOCET/ROXICET) 5-325 MG per tablet Take 1-2 tablets by mouth every 6 (six) hours as needed for severe pain. Patient not taking: Reported on 02/17/2017 05/02/14   Jerelyn Scott, MD  pravastatin (PRAVACHOL) 10 MG tablet Take 10 mg by mouth daily.    [provider]      Family History History reviewed. No pertinent family history.  Social History Social History  Substance Use Topics  . Smoking status: Current Every Day Smoker    Packs/day: 0.50    Types: Cigarettes  . Smokeless tobacco: Never Used  . Alcohol use 0.5 oz/week    1 Standard drinks or equivalent per week     Comment: RARE     Allergies   Asa [aspirin]; Bactrim [sulfamethoxazole-trimethoprim]; Flagyl [metronidazole]; Nsaids; Plavix [clopidogrel bisulfate]; Sulfa antibiotics; and Sulfamethoxazole   Review of Systems Review of Systems  Constitutional: Negative for appetite change, chills and fever.  Eyes: Negative for photophobia and visual disturbance.  Respiratory: Negative for cough, chest tightness, shortness of breath and wheezing.   Cardiovascular: Negative for chest pain and palpitations.  Gastrointestinal: Negative for abdominal pain, blood in stool, constipation, diarrhea, nausea and vomiting.  Genitourinary: Negative for dysuria, hematuria and urgency.  Musculoskeletal: Positive for arthralgias and myalgias. Negative for back pain.  Skin: Negative for rash.  Neurological: Negative for dizziness, syncope, weakness, light-headedness and numbness.     Physical Exam Updated Vital Signs BP (!) 149/97 (BP Location: Left Arm)   Pulse 80   Temp 97.9 F (36.6 C) (Oral)   Resp 16   SpO2 94%   Physical Exam  Constitutional: She appears well-developed and well-nourished. No distress.  HENT:  Head: Normocephalic and atraumatic.  Nose: Nose normal.  Eyes: Conjunctivae and EOM are normal. Left eye exhibits no discharge. No scleral icterus.  Neck: Normal range of motion. Neck supple.  Cardiovascular: Normal rate, regular rhythm, normal heart sounds and intact distal pulses.  Exam reveals no gallop and no friction rub.   No murmur heard. Pulmonary/Chest: Effort normal and breath sounds normal. No respiratory distress.  Abdominal: Soft. Bowel sounds are normal. She  exhibits no distension. There is no tenderness. There is no guarding.  Musculoskeletal: Normal range of motion. She exhibits tenderness. She exhibits no edema.  Tenderness to palpation of the entire left lower extremity. There are no visible bruises or other signs of trauma. Area appears neurovascularly intact. No visible deformity or temperature change noted. No midline lumbar spinal tenderness. Patient has full active and passive range of motion of the left leg. Good pulses and strength 5/5 bilaterally.  Neurological: She is alert. No sensory deficit. She exhibits normal muscle tone. Coordination normal.  Skin: Skin is warm and dry. No rash noted. No erythema.  Psychiatric: She has a normal mood and affect.  Nursing note and vitals reviewed.    ED Treatments / Results  Labs (all labs ordered are listed, but only abnormal results are displayed) Labs Reviewed  COMPREHENSIVE METABOLIC PANEL - Abnormal; Notable for the following:  Result Value   Total Protein 8.4 (*)    All other components within normal limits  ACETAMINOPHEN LEVEL - Abnormal; Notable for the following:    Acetaminophen (Tylenol), Serum <10 (*)    All other components within normal limits  CBC - Abnormal; Notable for the following:    RDW 15.7 (*)    All other components within normal limits  RAPID URINE DRUG SCREEN, HOSP PERFORMED - Abnormal; Notable for the following:    Cocaine POSITIVE (*)    All other components within normal limits  CBG MONITORING, ED - Abnormal; Notable for the following:    Glucose-Capillary 146 (*)    All other components within normal limits  ETHANOL  SALICYLATE LEVEL    EKG  EKG Interpretation None       Radiology No results found.  Procedures Procedures (including critical care time)  Medications Ordered in ED Medications  traMADol (ULTRAM) tablet 50 mg (50 mg Oral Given 02/17/17 1433)     Initial Impression / Assessment and Plan / ED Course  I have reviewed the  triage vital signs and the nursing notes.  Pertinent labs & imaging results that were available during my care of the patient were reviewed by me and considered in my medical decision making (see chart for details).     Patient presents with complaints of left leg pain that began 2-3 weeks ago. There are no physical deformities, erythema or temperature change. No history of injury or accident that brought on the pain. Area appears neurovascularly intact. No back pain. Presentation does not appear consistent with DVT As she is low risk. No concern for any spinal cord injury. Patient endorses mild pain relief with tramadol here in the ED. I considered giving her a Medrol Dosepak to help with inflammation. It appears that patient is diabetic and is not taking any medications. Her CBG today was 146. I informed her of the possibility of putting her on steroids and the effect that it would have on her blood sugars. Patient states that she has been taking her metformin and checks her blood sugars at home. She is willing to try the steroids and to monitor her blood sugars. No narcotic pain medication warranted at this time due to her history of drug abuse. Advised her to continue supportive care. There is no need for further imaging or testing warranted at this time. I advised her to follow up with her PCP for further evaluation if symptoms do not improve.  Patient appears medically cleared for psychiatric evaluation.  Final Clinical Impressions(s) / ED Diagnoses   Final diagnoses:  Left leg pain  Sciatica of left side    New Prescriptions New Prescriptions   METHYLPREDNISOLONE (MEDROL DOSEPAK) 4 MG TBPK TABLET    Taper over 6 days.     Dietrich Pates, PA-C 02/17/17 1526    Lavera Guise, MD 02/17/17 [phone]
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
## 2017-02-17 NOTE — ED Triage Notes (Addendum)
Pt reports she has L leg pain for the past 2 weeks. No known injuries. Walks with cane. Pt also reports stress in life, and that "she rather not be here." Denies SI/HI. Uses crack cocaine, last use yesterday. Wants psychiatric treatment
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
## 2017-02-17 NOTE — Tx Team (Signed)
Initial Treatment Plan 02/17/2017 8:50 PM [name] ZOX:[id number]    PATIENT STRESSORS: Financial difficulties Health problems Loss of friends mother Marital or family conflict Substance abuse   PATIENT STRENGTHS: Ability for insight Average or above average intelligence Communication skills Motivation for treatment/growth Supportive family/friends   PATIENT IDENTIFIED PROBLEMS: Substance Abuse  At risk for suicide  "Work out some of my mental health issues"  "Get clean"  "Go into a longterm facility"  "Get my own place"           DISCHARGE CRITERIA:  Ability to meet basic life and health needs Improved stabilization in mood, thinking, and/or behavior Medical problems require only outpatient monitoring Motivation to continue treatment in a less acute level of care Need for constant or close observation no longer present Reduction of life-threatening or endangering symptoms to within safe limits Verbal commitment to aftercare and medication compliance Withdrawal symptoms are absent or subacute and managed without 24-hour nursing intervention  PRELIMINARY DISCHARGE PLAN: Attend 12-step recovery group Outpatient therapy Return to previous living arrangement  PATIENT/FAMILY INVOLVEMENT: This treatment plan has been presented to and reviewed with the patient, Judy Schmidt.  The patient and family have been given the opportunity to ask questions and make suggestions.  Carleene OverlieMiddleton, Kedron Uno P, RN 02/17/2017, 8:50 PM
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
## 2017-02-18 DIAGNOSIS — F17210 Nicotine dependence, cigarettes, uncomplicated: Secondary | ICD-10-CM

## 2017-02-18 DIAGNOSIS — F333 Major depressive disorder, recurrent, severe with psychotic symptoms: Secondary | ICD-10-CM

## 2017-02-18 DIAGNOSIS — R45851 Suicidal ideations: Secondary | ICD-10-CM

## 2017-02-18 DIAGNOSIS — F1414 Cocaine abuse with cocaine-induced mood disorder: Secondary | ICD-10-CM

## 2017-02-18 LAB — GLUCOSE, CAPILLARY: GLUCOSE-CAPILLARY: 103 mg/dL — AB (ref 65–99)

## 2017-02-18 MED ORDER — KETOROLAC TROMETHAMINE 60 MG/2ML IM SOLN
30.0000 mg | Freq: Once | INTRAMUSCULAR | Status: AC
  Administered 2017-02-18: 30 mg via INTRAMUSCULAR
  Filled 2017-02-18 (×2): qty 2

## 2017-02-18 MED ORDER — DULOXETINE HCL 20 MG PO CPEP
40.0000 mg | ORAL_CAPSULE | Freq: Every day | ORAL | Status: DC
  Administered 2017-02-19: 40 mg via ORAL
  Filled 2017-02-18 (×3): qty 2

## 2017-02-18 NOTE — Progress Notes (Signed)
Patient ID: Judy Schmidt, female   DOB: 05/20/1966, 51 y.o.   MRN: [id number]  DAR: Pt. Denies SI/HI and A/V Hallucinations. She reports sleep is fair, appetite is fair, energy level is low, and concentration is fair. Patient rates depression, hopelessness, anxiety 5/10. Patient reports constant pain in her left hip that radiates to her leg. She is utilizing her walker and wheelchair in ambulation. MD Jonnalagadda was notified of patient's pain. PRN Tylenol was administered to patient per orders. Support and encouragement provided to the patient. Scheduled medications administered to patient per physician's orders. Patient is cooperative with Clinical research associatewriter although patient is in pain. She is seen mostly staying in her bed throughout most of the day resting. Q15 minute checks are maintained for safety.

## 2017-02-18 NOTE — Progress Notes (Signed)
D   Pt doing some significant moaning and having trouble positioning herself in the bed due to her pain   She was in bed most of the shift due to her pain as well   She is cooperative and compliant  A    Verbal support given   Medications educated on and administered   Effectiveness evaluated   Put top siderails up so patient can use her arms to pull herself up and change positions   Q 15 min checks R   Pt is safe and resting quietly

## 2017-02-18 NOTE — Tx Team (Signed)
Interdisciplinary Treatment and Diagnostic Plan Update  02/18/2017 Time of Session: 0930 Judy Schmidt MRN: [id number]  Principal Diagnosis: MDD (major depressive disorder)  Secondary Diagnoses: Principal Problem:   MDD (major depressive disorder) Active Problems:   Cocaine abuse with cocaine-induced mood disorder (HCC)   Current Medications:  Current Facility-Administered Medications  Medication Dose Route Frequency Provider Last Rate Last Dose  . acetaminophen (TYLENOL) tablet 650 mg  650 mg Oral Q6H PRN Okonkwo, Justina A, NP      . alum & mag hydroxide-simeth (MAALOX/MYLANTA) 200-200-20 MG/5ML suspension 30 mL  30 mL Oral Q4H PRN Okonkwo, Justina A, NP      . amLODipine (NORVASC) tablet 10 mg  10 mg Oral Daily Donell SievertSimon, Spencer E, PA-C   10 mg at 02/18/17 0831  . baclofen (LIORESAL) tablet 10 mg  10 mg Oral TID Donell SievertSimon, Spencer E, PA-C   10 mg at 02/18/17 1220  . [START ON 02/19/2017] DULoxetine (CYMBALTA) DR capsule 40 mg  40 mg Oral Daily Leata MouseJonnalagadda, Janardhana, MD      . hydrOXYzine (ATARAX/VISTARIL) tablet 25 mg  25 mg Oral TID PRN Beryle Lathekonkwo, Justina A, NP   25 mg at 02/17/17 2210  . lidocaine (LIDODERM) 5 % 1 patch  1 patch Transdermal Q24H Kerry HoughSimon, Spencer E, PA-C   1 patch at 02/17/17 2210  . magnesium hydroxide (MILK OF MAGNESIA) suspension 30 mL  30 mL Oral Daily PRN Okonkwo, Justina A, NP      . memantine (NAMENDA) tablet 5 mg  5 mg Oral BID Donell SievertSimon, Spencer E, PA-C   5 mg at 02/18/17 0831  . metFORMIN (GLUCOPHAGE) tablet 500 mg  500 mg Oral BID WC Donell SievertSimon, Spencer E, PA-C   500 mg at 02/18/17 0831  . metoprolol tartrate (LOPRESSOR) tablet 50 mg  50 mg Oral BID Kerry HoughSimon, Spencer E, PA-C      . mometasone-formoterol (DULERA) 200-5 MCG/ACT inhaler 2 puff  2 puff Inhalation BID Donell SievertSimon, Spencer E, PA-C      . nicotine (NICODERM CQ - dosed in mg/24 hours) patch 21 mg  21 mg Transdermal Daily Adonis BrookAgustin, Sheila, NP   21 mg at 02/18/17 [id number]  . pantoprazole (PROTONIX) EC tablet 40 mg  40 mg Oral Daily  Kerry HoughSimon, Spencer E, PA-C   40 mg at 02/18/17 0831  . pravastatin (PRAVACHOL) tablet 10 mg  10 mg Oral Daily Kerry HoughSimon, Spencer E, PA-C   10 mg at 02/18/17 0859  . traZODone (DESYREL) tablet 50 mg  50 mg Oral QHS PRN Okonkwo, Justina A, NP   50 mg at 02/17/17 2210   PTA Medications: Prescriptions Prior to Admission  Medication Sig Dispense Refill Last Dose  . amLODipine (NORVASC) 10 MG tablet Take 10 mg by mouth daily.   Past Week at Unknown time  . baclofen (LIORESAL) 10 MG tablet Take 10 mg by mouth 3 (three) times daily.   Past Week at Unknown time  . diclofenac sodium (VOLTAREN) 1 % GEL Apply 2 g topically 4 (four) times daily as needed. 100 g 0 Past Week at Unknown time  . DULoxetine (CYMBALTA) 20 MG capsule Take 20 mg by mouth daily.   Past Week at Unknown time  . DULoxetine (CYMBALTA) 30 MG capsule Take 30 mg by mouth daily.   Past Week at Unknown time  . esomeprazole (NEXIUM) 40 MG capsule Take 40 mg by mouth daily at 12 noon.   Past Week at Unknown time  . fluticasone-salmeterol (ADVAIR HFA) 115-21 MCG/ACT inhaler Inhale 2 puffs into  the lungs 2 (two) times daily.   Past Week at Unknown time  . ibuprofen (ADVIL,MOTRIN) 800 MG tablet Take 1 tablet (800 mg total) by mouth every 8 (eight) hours as needed for mild pain. (Patient not taking: Reported on 02/17/2017) 30 tablet 0 Completed Course at Unknown time  . lidocaine (LIDODERM) 5 % Place 1 patch onto the skin daily. Remove & Discard patch within 12 hours or as directed by MD 30 patch 0 Past Week at Unknown time  . memantine (NAMENDA) 5 MG tablet Take 5 mg by mouth 2 (two) times daily.   Past Week at Unknown time  . metFORMIN (GLUCOPHAGE) 500 MG tablet Take by mouth 2 (two) times daily with a meal.   Past Week at Unknown time  . methylPREDNISolone (MEDROL DOSEPAK) 4 MG TBPK tablet Taper over 6 days. 21 tablet 0   . metoprolol (LOPRESSOR) 50 MG tablet Take 50 mg by mouth 2 (two) times daily.   Past Week at Unknown time  . oxyCODONE-acetaminophen  (PERCOCET/ROXICET) 5-325 MG per tablet Take 1-2 tablets by mouth every 6 (six) hours as needed for severe pain. (Patient not taking: Reported on 02/17/2017) 15 tablet 0 Completed Course at Unknown time  . pravastatin (PRAVACHOL) 10 MG tablet Take 10 mg by mouth daily.     . Pregabalin (LYRICA PO) Take by mouth.   Past Week at Unknown time  . promethazine (PHENERGAN) 25 MG tablet Take 25 mg by mouth every 6 (six) hours as needed for nausea or vomiting.   Past Week at Unknown time  . venlafaxine (EFFEXOR) 37.5 MG tablet Take 37.5 mg by mouth 2 (two) times daily.   Past Week at Unknown time    Patient Stressors: Financial difficulties Health problems Loss of friends mother Marital or family conflict Substance abuse  Patient Strengths: Ability for insight Average or above average Radio producer for treatment/growth Supportive family/friends  Treatment Modalities: Medication Management, Group therapy, Case management,  1 to 1 session with clinician, Psychoeducation, Recreational therapy.   Physician Treatment Plan for Primary Diagnosis: MDD (major depressive disorder) Long Term Goal(s): Improvement in symptoms so as ready for discharge Improvement in symptoms so as ready for discharge   Short Term Goals: Ability to identify changes in lifestyle to reduce recurrence of condition will improve Ability to verbalize feelings will improve Ability to disclose and discuss suicidal ideas Ability to demonstrate self-control will improve Ability to identify and develop effective coping behaviors will improve Ability to maintain clinical measurements within normal limits will improve Compliance with prescribed medications will improve Ability to identify triggers associated with substance abuse/mental health issues will improve  Medication Management: Evaluate patient's response, side effects, and tolerance of medication regimen.  Therapeutic Interventions: 1 to 1  sessions, Unit Group sessions and Medication administration.  Evaluation of Outcomes: Progressing  Physician Treatment Plan for Secondary Diagnosis: Principal Problem:   MDD (major depressive disorder) Active Problems:   Cocaine abuse with cocaine-induced mood disorder (HCC)  Long Term Goal(s): Improvement in symptoms so as ready for discharge Improvement in symptoms so as ready for discharge   Short Term Goals: Ability to identify changes in lifestyle to reduce recurrence of condition will improve Ability to verbalize feelings will improve Ability to disclose and discuss suicidal ideas Ability to demonstrate self-control will improve Ability to identify and develop effective coping behaviors will improve Ability to maintain clinical measurements within normal limits will improve Compliance with prescribed medications will improve Ability to identify triggers associated with  substance abuse/mental health issues will improve     Medication Management: Evaluate patient's response, side effects, and tolerance of medication regimen.  Therapeutic Interventions: 1 to 1 sessions, Unit Group sessions and Medication administration.  Evaluation of Outcomes: Progressing   RN Treatment Plan for Primary Diagnosis: MDD (major depressive disorder) Long Term Goal(s): Knowledge of disease and therapeutic regimen to maintain health will improve  Short Term Goals: Ability to remain free from injury will improve, Ability to verbalize feelings will improve and Ability to disclose and discuss suicidal ideas  Medication Management: RN will administer medications as ordered by provider, will assess and evaluate patient's response and provide education to patient for prescribed medication. RN will report any adverse and/or side effects to prescribing provider.  Therapeutic Interventions: 1 on 1 counseling sessions, Psychoeducation, Medication administration, Evaluate responses to treatment, Monitor vital  signs and CBGs as ordered, Perform/monitor CIWA, COWS, AIMS and Fall Risk screenings as ordered, Perform wound care treatments as ordered.  Evaluation of Outcomes: Progressing   LCSW Treatment Plan for Primary Diagnosis: MDD (major depressive disorder) Long Term Goal(s): Safe transition to appropriate next level of care at discharge, Engage patient in therapeutic group addressing interpersonal concerns.  Short Term Goals: Engage patient in aftercare planning with referrals and resources, Facilitate patient progression through stages of change regarding substance use diagnoses and concerns and Identify triggers associated with mental health/substance abuse issues  Therapeutic Interventions: Assess for all discharge needs, 1 to 1 time with Social worker, Explore available resources and support systems, Assess for adequacy in community support network, Educate family and significant other(s) on suicide prevention, Complete Psychosocial Assessment, Interpersonal group therapy.  Evaluation of Outcomes: Progressing   Progress in Treatment: Attending groups: No. New to unit. Continuing to assess.  Participating in groups: No. Taking medication as prescribed: Yes. Toleration medication: Yes. Family/Significant other contact made: No, will contact:  family member if patient consents Patient understands diagnosis: Yes. Discussing patient identified problems/goals with staff: Yes. Medical problems stabilized or resolved: Yes. Denies suicidal/homicidal ideation: No. Passive SI/Able to contract for safety on the unit.  Issues/concerns per patient self-inventory: No. Other: n/a   New problem(s) identified: No, Describe:  n/a  New Short Term/Long Term Goal(s): detox; elimination of AVH/SI thoughts, medication stabilization, and development of comprehensive mental wellness/sobriety plan.   Discharge Plan or Barriers: CSW assessing for appropriate referrals. Pt reports that she lives in [place], [place]  and goes to [facility] for medication management only.   Reason for Continuation of Hospitalization: Depression Hallucinations Medication stabilization Suicidal ideation Withdrawal symptoms  Estimated Length of Stay: 3-5 days   Attendees: Patient: 02/18/2017 3:51 PM  Physician: Dr. Elna Breslow MD 02/18/2017 3:51 PM  Nursing: Patrecia Pour RN 02/18/2017 3:51 PM  RN Care Manager: Onnie Boer CM 02/18/2017 3:51 PM  Social Worker: Trula Slade, LCSW 02/18/2017 3:51 PM  Recreational Therapist: x 02/18/2017 3:51 PM  Other: Armandina Stammer NP; Gray Bernhardt NP 02/18/2017 3:51 PM  Other:  02/18/2017 3:51 PM  Other: 02/18/2017 3:51 PM    Scribe for Treatment Team: Ledell Peoples Smart, LCSW 02/18/2017 3:51 PM

## 2017-02-18 NOTE — Social Work (Signed)
Referred to [facility], is [place] resident.  Sadiel Mota, LCSW Lead Clinical Social Worker Phone:  [phone]  

## 2017-02-18 NOTE — Progress Notes (Signed)
Recreation Therapy Notes  Date: 02/18/17 Time: 0930 Location: 300 Hall Dayroom  Group Topic: Stress Management  Goal Area(s) Addresses:  Patient will verbalize importance of using healthy stress management.  Patient will identify positive emotions associated with healthy stress management.   Intervention: Stress Management  Activity :  Body Scan Meditation.  LRT introduced the stress management technique of meditation.  LRT played a meditation to allow patients to take inventory of the sensations they are feeling in their body.  Patients were to follow along as the meditation was played to fully engage in the technique.  Education:  Stress Management, Discharge Planning.   Education Outcome: Acknowledges edcuation/In group clarification offered/Needs additional education  Clinical Observations/Feedback: Pt did not attend group.   Ephriam Turman, LRT/CTRS         Nyheim Seufert A 02/18/2017 11:31 AM 

## 2017-02-18 NOTE — B[facility] Counselor (Signed)
CSW attempted to complete PSA. Pt sleeping in room. Will attempt again in the morning.  Trula SladeHeather Smart, MSW, LCSW Clinical Social Worker 02/18/2017 3:51 PM
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
## 2017-02-18 NOTE — H&P (Signed)
Psychiatric Admission Assessment Adult  Patient Identification: Judy Schmidt MRN:  [id number] Date of Evaluation:  02/18/2017 Chief Complaint:  Schizophrenia Cocaine use disorder severe Principal Diagnosis: MDD (major depressive disorder) Diagnosis:   Patient Active Problem List   Diagnosis Date Noted  . Cocaine abuse with cocaine-induced mood disorder ([place]) [F14.14] 02/18/2017  . MDD (major depressive disorder) [F32.9] 02/17/2017   History of Present Illness: Judy Schmidt is an 51 y.o. female seen, chart reviewed and she stayed in her room due to increased left leg pain from hip to bottom of her foot. She reports recently relapsed on cocaine and got into strained relationship with her daughter and increased symptoms of depression, anxiety and suicide idation. She endorses auditory and visual hallucinations. She is seeing dark shadows, people and hear people talking but can not tell what she hears.    Below information from behavioral health assessment has been reviewed by me and I agreed with the findings.  Judy Schmidt is a 51 years female who came to [facility] as a walk in with issues surrounding a recent relapse on crack cocaine as well as suicidal ideations, depression, auditory and visual hallucinations and paranoia over the past 3 weeks. She states that she is currently living with her daughter and has a strained relationship with her and her son in law because of her use of crack. She states that she was "clean for 12 years before relapsing a month ago". She states that she has a history of schizophrenia and has been having a lot of auditory and visual hallucinations recently as well and has been "seeing shadows and hearing voices". She states that she currently goes to see a psychiatrist at [facility]. She does not have a therapist at the moment. Pt has a history of suicide attempt in 2007 where she overdosed on medication and xanex. She states that she "wanted to die" because of her addiction and was  admitted to an inpatient facility in [facility]. She states that she got substance abuse treatment for 30 days after that and she has been mostly clean until her relapse a month ago. Pt states that "if she has to live like this she'd rather not be here". Pt was tearful and depressed during assessment. She states that she has been to "a lot of places but no one will help her". Pt denies HI. Pt presents with difficulty walking on her left leg and is ambulating with a cane. She states that she has a lot of pain from her back down her leg and doesn't know what the cause is. She states this has been going on for 2 weeks Associated Signs/Symptoms: Depression Symptoms:  depressed mood, anhedonia, insomnia, psychomotor retardation, fatigue, feelings of worthlessness/guilt, hopelessness, recurrent thoughts of death, anxiety, loss of energy/fatigue, weight loss, decreased labido, decreased appetite, (Hypo) Manic Symptoms:  Impulsivity, Labiality of Mood, Anxiety Symptoms:  Excessive Worry, Psychotic Symptoms:  Hallucinations: Auditory PTSD Symptoms: NA Total Time spent with patient: 1 hour  Past Psychiatric History: She has history of depression and received out patient treatment at [facility] and has history of rehab treatment in the past,   Is the patient at risk to self? Yes.    Has the patient been a risk to self in the past 6 months? No.  Has the patient been a risk to self within the distant past? No.  Is the patient a risk to others? Yes.    Has the patient been a risk to others in the past 6 months? No.  Has the patient been a risk to others within the distant past? No.   Prior Inpatient Therapy: Prior Inpatient Therapy: Yes Prior Therapy Dates: 2007 Prior Therapy Facilty/Provider(s): facility in [facility]  Reason for Treatment: overdosed- substance abuse and suicidal ideations Prior Outpatient Therapy: Prior Outpatient Therapy: Yes Prior Therapy Dates: ongoing Prior Therapy Facilty/Provider(s): [facility]   Reason for Treatment: Schizophrenia Does patient have an ACCT team?: No Does patient have Intensive In-House Services?  : No Does patient have [facility] services? : No Does patient have P4CC services?: No  Alcohol Screening: 1. How often do you have a drink containing alcohol?: Never 9. Have you or someone else been injured as a result of your drinking?: No 10. Has a relative or friend or a doctor or another health worker been concerned about your drinking or suggested you cut down?: No Alcohol Use Disorder Identification Test Final Score (AUDIT): 0 Brief Intervention: AUDIT score less than 7 or less-screening does not suggest unhealthy drinking-brief intervention not indicated Substance Abuse History in the last 12 months:  Yes.   Consequences of Substance Abuse: Family Consequences:  conflict wtih daughter and strained relationship due to cocaine abuse. Previous Psychotropic Medications: Yes  Psychological Evaluations: Yes  Past Medical History:  Past Medical History:  Diagnosis Date  . Bulging lumbar disc   . COPD (chronic obstructive pulmonary disease) ([place])   . Depression   . Diabetes mellitus without complication ([place])   . Gall stones   . High cholesterol   . Hyperlipemia   . Hypertension   . Sciatica   . Thyroid disease     Past Surgical History:  Procedure Laterality Date  . HERNIA REPAIR    . KNEE SURGERY    . TUBAL LIGATION     Family History: History reviewed. No pertinent family history. Family Psychiatric  History: denied Tobacco Screening: Have you used any form of tobacco in the last 30 days? (Cigarettes, Smokeless Tobacco, Cigars, and/or Pipes): Yes Tobacco use, Select all that apply: 5 or more cigarettes per day Are you interested in Tobacco Cessation Medications?: Yes, will notify MD for an order Counseled patient on smoking cessation including recognizing danger situations, developing coping skills and basic information about quitting provided: Yes Social  History:  History  Alcohol Use  . 0.5 oz/week  . 1 Standard drinks or equivalent per week    Comment: RARE     History  Drug Use No    Additional Social History: Marital status: Single    History of alcohol / drug use?: Yes Longest period of sobriety (when/how long): 12 years  Negative Consequences of Use: Financial, Personal relationships Name of Substance 1: crack cocaine 1 - Age of First Use: unknown 1 - Amount (size/oz): $80-$100 per day 1 - Frequency: daily  1 - Duration: daily  1 - Last Use / Amount: yesterday at 5p                  Allergies:   Allergies  Allergen Reactions  . Asa [Aspirin]   . Bactrim [Sulfamethoxazole-Trimethoprim]   . Flagyl [Metronidazole]   . Nsaids Other (See Comments)    ulcer  . Plavix [Clopidogrel Bisulfate]   . Sulfa Antibiotics   . Sulfamethoxazole Rash   Lab Results:  Results for orders placed or performed during the hospital encounter of 02/17/17 (from the past 48 hour(s))  Comprehensive metabolic panel     Status: Abnormal   Collection Time: 02/17/17 12:25 PM  Result Value Ref Range  Sodium 141 135 - 145 mmol/L   Potassium 3.6 3.5 - 5.1 mmol/L   Chloride 106 101 - 111 mmol/L   CO2 27 22 - 32 mmol/L   Glucose, Bld 97 65 - 99 mg/dL   BUN 13 6 - 20 mg/dL   Creatinine, Ser 0.73 0.44 - 1.00 mg/dL   Calcium 10.0 8.9 - 10.3 mg/dL   Total Protein 8.4 (H) 6.5 - 8.1 g/dL   Albumin 4.5 3.5 - 5.0 g/dL   AST 19 15 - 41 U/L   ALT 14 14 - 54 U/L   Alkaline Phosphatase 72 38 - 126 U/L   Total Bilirubin 0.6 0.3 - 1.2 mg/dL   GFR calc non Af Amer >60 >60 mL/min   GFR calc Af Amer >60 >60 mL/min    Comment: (NOTE) The eGFR has been calculated using the CKD EPI equation. This calculation has not been validated in all clinical situations. eGFR's persistently <60 mL/min signify possible Chronic Kidney Disease.    Anion gap 8 5 - 15  cbc     Status: Abnormal   Collection Time: 02/17/17 12:25 PM  Result Value Ref Range   WBC  10.0 4.0 - 10.5 K/uL   RBC 4.81 3.87 - 5.11 MIL/uL   Hemoglobin 13.7 12.0 - 15.0 g/dL   HCT 40.9 36.0 - 46.0 %   MCV 85.0 78.0 - 100.0 fL   MCH 28.5 26.0 - 34.0 pg   MCHC 33.5 30.0 - 36.0 g/dL   RDW 15.7 (H) 11.5 - 15.5 %   Platelets 292 150 - 400 K/uL  Ethanol     Status: None   Collection Time: 02/17/17 12:26 PM  Result Value Ref Range   Alcohol, Ethyl (B) <5 <5 mg/dL    Comment:        LOWEST DETECTABLE LIMIT FOR SERUM ALCOHOL IS 5 mg/dL FOR MEDICAL PURPOSES ONLY   Salicylate level     Status: None   Collection Time: 02/17/17 12:26 PM  Result Value Ref Range   Salicylate Lvl <3.8 2.8 - 30.0 mg/dL  Acetaminophen level     Status: Abnormal   Collection Time: 02/17/17 12:26 PM  Result Value Ref Range   Acetaminophen (Tylenol), Serum <10 (L) 10 - 30 ug/mL    Comment:        THERAPEUTIC CONCENTRATIONS VARY SIGNIFICANTLY. A RANGE OF 10-30 ug/mL MAY BE AN EFFECTIVE CONCENTRATION FOR MANY PATIENTS. HOWEVER, SOME ARE BEST TREATED AT CONCENTRATIONS OUTSIDE THIS RANGE. ACETAMINOPHEN CONCENTRATIONS >150 ug/mL AT 4 HOURS AFTER INGESTION AND >50 ug/mL AT 12 HOURS AFTER INGESTION ARE OFTEN ASSOCIATED WITH TOXIC REACTIONS.   CBG monitoring, ED     Status: Abnormal   Collection Time: 02/17/17  1:19 PM  Result Value Ref Range   Glucose-Capillary 146 (H) 65 - 99 mg/dL  Rapid urine drug screen (hospital performed)     Status: Abnormal   Collection Time: 02/17/17  2:09 PM  Result Value Ref Range   Opiates NONE DETECTED NONE DETECTED   Cocaine POSITIVE (A) NONE DETECTED   Benzodiazepines NONE DETECTED NONE DETECTED   Amphetamines NONE DETECTED NONE DETECTED   Tetrahydrocannabinol NONE DETECTED NONE DETECTED   Barbiturates NONE DETECTED NONE DETECTED    Comment:        DRUG SCREEN FOR MEDICAL PURPOSES ONLY.  IF CONFIRMATION IS NEEDED FOR ANY PURPOSE, NOTIFY LAB WITHIN 5 DAYS.        LOWEST DETECTABLE LIMITS FOR URINE DRUG SCREEN Drug Class  Cutoff (ng/mL) Amphetamine       1000 Barbiturate      200 Benzodiazepine   917 Tricyclics       915 Opiates          300 Cocaine          300 THC              50     Blood Alcohol level:  Lab Results  Component Value Date   ETH <5 05/69/7948    Metabolic Disorder Labs:  No results found for: HGBA1C, MPG No results found for: PROLACTIN No results found for: CHOL, TRIG, HDL, CHOLHDL, VLDL, LDLCALC  Current Medications: Current Facility-Administered Medications  Medication Dose Route Frequency Provider Last Rate Last Dose  . acetaminophen (TYLENOL) tablet 650 mg  650 mg Oral Q6H PRN Okonkwo, Justina A, NP      . alum & mag hydroxide-simeth (MAALOX/MYLANTA) 200-200-20 MG/5ML suspension 30 mL  30 mL Oral Q4H PRN Okonkwo, Justina A, NP      . amLODipine (NORVASC) tablet 10 mg  10 mg Oral Daily Patriciaann Clan E, PA-C   10 mg at 02/18/17 0831  . baclofen (LIORESAL) tablet 10 mg  10 mg Oral TID Patriciaann Clan E, PA-C   10 mg at 02/18/17 1220  . DULoxetine (CYMBALTA) DR capsule 30 mg  30 mg Oral Daily Laverle Hobby, PA-C   30 mg at 02/18/17 0831  . hydrOXYzine (ATARAX/VISTARIL) tablet 25 mg  25 mg Oral TID PRN Hughie Closs A, NP   25 mg at 02/17/17 2210  . lidocaine (LIDODERM) 5 % 1 patch  1 patch Transdermal Q24H Laverle Hobby, PA-C   1 patch at 02/17/17 2210  . magnesium hydroxide (MILK OF MAGNESIA) suspension 30 mL  30 mL Oral Daily PRN Okonkwo, Justina A, NP      . memantine (NAMENDA) tablet 5 mg  5 mg Oral BID Patriciaann Clan E, PA-C   5 mg at 02/18/17 0831  . metFORMIN (GLUCOPHAGE) tablet 500 mg  500 mg Oral BID WC Patriciaann Clan E, PA-C   500 mg at 02/18/17 0831  . metoprolol tartrate (LOPRESSOR) tablet 50 mg  50 mg Oral BID Laverle Hobby, PA-C      . mometasone-formoterol (DULERA) 200-5 MCG/ACT inhaler 2 puff  2 puff Inhalation BID Patriciaann Clan E, PA-C      . nicotine (NICODERM CQ - dosed in mg/24 hours) patch 21 mg  21 mg Transdermal Daily Kerrie Buffalo, NP   21 mg at 02/18/17 0165  .  pantoprazole (PROTONIX) EC tablet 40 mg  40 mg Oral Daily Laverle Hobby, PA-C   40 mg at 02/18/17 0831  . pravastatin (PRAVACHOL) tablet 10 mg  10 mg Oral Daily Laverle Hobby, PA-C   10 mg at 02/18/17 0859  . traZODone (DESYREL) tablet 50 mg  50 mg Oral QHS PRN Okonkwo, Justina A, NP   50 mg at 02/17/17 2210   PTA Medications: Prescriptions Prior to Admission  Medication Sig Dispense Refill Last Dose  . amLODipine (NORVASC) 10 MG tablet Take 10 mg by mouth daily.   Past Week at Unknown time  . baclofen (LIORESAL) 10 MG tablet Take 10 mg by mouth 3 (three) times daily.   Past Week at Unknown time  . diclofenac sodium (VOLTAREN) 1 % GEL Apply 2 g topically 4 (four) times daily as needed. 100 g 0 Past Week at Unknown time  . DULoxetine (CYMBALTA) 20 MG capsule Take  20 mg by mouth daily.   Past Week at Unknown time  . DULoxetine (CYMBALTA) 30 MG capsule Take 30 mg by mouth daily.   Past Week at Unknown time  . esomeprazole (NEXIUM) 40 MG capsule Take 40 mg by mouth daily at 12 noon.   Past Week at Unknown time  . fluticasone-salmeterol (ADVAIR HFA) 115-21 MCG/ACT inhaler Inhale 2 puffs into the lungs 2 (two) times daily.   Past Week at Unknown time  . ibuprofen (ADVIL,MOTRIN) 800 MG tablet Take 1 tablet (800 mg total) by mouth every 8 (eight) hours as needed for mild pain. (Patient not taking: Reported on 02/17/2017) 30 tablet 0 Completed Course at Unknown time  . lidocaine (LIDODERM) 5 % Place 1 patch onto the skin daily. Remove & Discard patch within 12 hours or as directed by MD 30 patch 0 Past Week at Unknown time  . memantine (NAMENDA) 5 MG tablet Take 5 mg by mouth 2 (two) times daily.   Past Week at Unknown time  . metFORMIN (GLUCOPHAGE) 500 MG tablet Take by mouth 2 (two) times daily with a meal.   Past Week at Unknown time  . methylPREDNISolone (MEDROL DOSEPAK) 4 MG TBPK tablet Taper over 6 days. 21 tablet 0   . metoprolol (LOPRESSOR) 50 MG tablet Take 50 mg by mouth 2 (two) times daily.    Past Week at Unknown time  . oxyCODONE-acetaminophen (PERCOCET/ROXICET) 5-325 MG per tablet Take 1-2 tablets by mouth every 6 (six) hours as needed for severe pain. (Patient not taking: Reported on 02/17/2017) 15 tablet 0 Completed Course at Unknown time  . pravastatin (PRAVACHOL) 10 MG tablet Take 10 mg by mouth daily.     . Pregabalin (LYRICA PO) Take by mouth.   Past Week at Unknown time  . promethazine (PHENERGAN) 25 MG tablet Take 25 mg by mouth every 6 (six) hours as needed for nausea or vomiting.   Past Week at Unknown time  . venlafaxine (EFFEXOR) 37.5 MG tablet Take 37.5 mg by mouth 2 (two) times daily.   Past Week at Unknown time    Musculoskeletal: Strength & Muscle Tone: decreased Gait & Station: unable to stand Patient leans: N/A  Psychiatric Specialty Exam: Physical Exam Full physical performed in Emergency Department. I have reviewed this assessment and concur with its findings.   ROS complained of depresion, substance abuse and leg pain and no known leg injuries.She uses walker for mobility. No Fever-chills, No Headache, No changes with Vision or hearing, reports vertigo No problems swallowing food or Liquids, No Chest pain, Cough or Shortness of Breath, No Abdominal pain, No Nausea or Vommitting, Bowel movements are regular, No Blood in stool or Urine, No dysuria, No new skin rashes or bruises, No new joints pains-aches,  No new weakness, tingling, numbness in any extremity, No recent weight gain or loss, No polyuria, polydypsia or polyphagia,  A full 10 point Review of Systems was done, except as stated above, all other Review of Systems were negative.  Blood pressure 133/81, pulse 74, temperature 98.2 F (36.8 C), temperature source Oral, resp. rate 17, height 5' 4"  (1.626 m), weight 68 kg (150 lb), SpO2 100 %.Body mass index is 25.75 kg/m.  General Appearance: Disheveled and Guarded  Eye Contact:  Good  Speech:  Clear and Coherent  Volume:  Decreased  Mood:   Anxious, Depressed, Hopeless and Worthless  Affect:  Constricted and Depressed  Thought Process:  Coherent and Goal Directed  Orientation:  Full (Time, Place, and  Person)  Thought Content:  Rumination  Suicidal Thoughts:  Yes.  without intent/plan  Homicidal Thoughts:  No  Memory:  Immediate;   Good Recent;   Fair Remote;   Fair  Judgement:  Impaired  Insight:  Fair  Psychomotor Activity:  Decreased  Concentration:  Concentration: Fair and Attention Span: Fair  Recall:  Good  Fund of Knowledge:  Good  Language:  Good  Akathisia:  Negative  Handed:  Right  AIMS (if indicated):     Assets:  Communication Skills Desire for Improvement Financial Resources/Insurance Housing Leisure Time Resilience Social Support  ADL's:  Intact  Cognition:  WNL  Sleep:  Number of Hours: 6.5    Treatment Plan Summary: Daily contact with patient to assess and evaluate symptoms and progress in treatment and Medication management  Observation Level/Precautions:  15 minute checks  Laboratory:  reviewed admission labs  Psychotherapy:  Group   Medications:  Restart cymbalta for depression  Consultations:  As needed  Discharge Concerns:  safety  Estimated LOS: 5-7 days  Other:     Physician Treatment Plan for Primary Diagnosis: MDD (major depressive disorder) Long Term Goal(s): Improvement in symptoms so as ready for discharge  Short Term Goals: Ability to identify changes in lifestyle to reduce recurrence of condition will improve, Ability to verbalize feelings will improve, Ability to disclose and discuss suicidal ideas and Ability to demonstrate self-control will improve  Physician Treatment Plan for Secondary Diagnosis: Principal Problem:   MDD (major depressive disorder) Active Problems:   Cocaine abuse with cocaine-induced mood disorder ([place])  Long Term Goal(s): Improvement in symptoms so as ready for discharge  Short Term Goals: Ability to identify and develop effective coping  behaviors will improve, Ability to maintain clinical measurements within normal limits will improve, Compliance with prescribed medications will improve and Ability to identify triggers associated with substance abuse/mental health issues will improve  I certify that inpatient services furnished can reasonably be expected to improve the patient's condition.    Ambrose Finland, MD 5/30/20181:57 PM

## 2017-02-18 NOTE — [facility] Group Notes (Signed)
[facility] LCSW Group Therapy  02/18/2017 12:52 PM  Type of Therapy:  Group Therapy  Participation Level:  Did Not Attend-pt invited. Chose to remain in bed.   Summary of Progress/Problems: Today's Topic: Overcoming Obstacles. Patients identified one short term goal and potential obstacles in reaching this goal. Patients processed barriers involved in overcoming these obstacles. Patients identified steps necessary for overcoming these obstacles and explored motivation (internal and external) for facing these difficulties head on.   Judy Schmidt N Smart LCSW 02/18/2017, 12:52 PM

## 2017-02-19 LAB — GLUCOSE, CAPILLARY: GLUCOSE-CAPILLARY: 109 mg/dL — AB (ref 65–99)

## 2017-02-19 MED ORDER — METFORMIN HCL 500 MG PO TABS: 500.0000 mg | ORAL_TABLET | Freq: Two times a day (BID) | ORAL | Status: AC

## 2017-02-19 MED ORDER — HYDROXYZINE HCL 25 MG PO TABS: 25.0000 mg | ORAL_TABLET | Freq: Three times a day (TID) | ORAL | 0 refills | Status: DC | PRN

## 2017-02-19 MED ORDER — LIDOCAINE 5 % EX PTCH: 1.0000 | MEDICATED_PATCH | CUTANEOUS | 0 refills | Status: DC

## 2017-02-19 MED ORDER — METOPROLOL TARTRATE 50 MG PO TABS: 50.0000 mg | ORAL_TABLET | Freq: Two times a day (BID) | ORAL | Status: AC

## 2017-02-19 MED ORDER — DULOXETINE HCL 40 MG PO CPEP: 40.0000 mg | ORAL_CAPSULE | Freq: Every day | ORAL | 0 refills | Status: DC

## 2017-02-19 MED ORDER — PRAVASTATIN SODIUM 10 MG PO TABS: 10.0000 mg | ORAL_TABLET | Freq: Every day | ORAL | Status: AC

## 2017-02-19 MED ORDER — FLUTICASONE-SALMETEROL 115-21 MCG/ACT IN AERO: 2.0000 | INHALATION_SPRAY | Freq: Two times a day (BID) | RESPIRATORY_TRACT | 0 refills | Status: DC

## 2017-02-19 MED ORDER — ESOMEPRAZOLE MAGNESIUM 40 MG PO CPDR: 40.0000 mg | DELAYED_RELEASE_CAPSULE | Freq: Every day | ORAL | Status: DC

## 2017-02-19 MED ORDER — BACLOFEN 10 MG PO TABS: 10.0000 mg | ORAL_TABLET | Freq: Three times a day (TID) | ORAL | 0 refills | Status: DC

## 2017-02-19 MED ORDER — MEMANTINE HCL 5 MG PO TABS: 5.0000 mg | ORAL_TABLET | Freq: Two times a day (BID) | ORAL | 0 refills | Status: DC

## 2017-02-19 MED ORDER — AMLODIPINE BESYLATE 10 MG PO TABS: 10.0000 mg | ORAL_TABLET | Freq: Every day | ORAL | Status: AC

## 2017-02-19 MED ORDER — NICOTINE 21 MG/24HR TD PT24: 21.0000 mg | MEDICATED_PATCH | Freq: Every day | TRANSDERMAL | 0 refills | Status: DC

## 2017-02-19 MED ORDER — TRAZODONE HCL 50 MG PO TABS: 50.0000 mg | ORAL_TABLET | Freq: Every evening | ORAL | 0 refills | Status: DC | PRN

## 2017-02-19 NOTE — Tx Team (Signed)
Interdisciplinary Treatment and Diagnostic Plan Update  02/19/2017 Time of Session: 0930 Judy Schmidt MRN: [id number]  Principal Diagnosis: MDD (major depressive disorder)  Secondary Diagnoses: Principal Problem:   MDD (major depressive disorder) Active Problems:   Cocaine abuse with cocaine-induced mood disorder ([place])   Current Medications:  Current Facility-Administered Medications  Medication Dose Route Frequency Provider Last Rate Last Dose  . acetaminophen (TYLENOL) tablet 650 mg  650 mg Oral Q6H PRN Okonkwo, Justina A, NP   650 mg at 02/18/17 1725  . alum & mag hydroxide-simeth (MAALOX/MYLANTA) 200-200-20 MG/5ML suspension 30 mL  30 mL Oral Q4H PRN Okonkwo, Justina A, NP      . amLODipine (NORVASC) tablet 10 mg  10 mg Oral Daily Patriciaann Clan E, PA-C   10 mg at 02/19/17 6269  . baclofen (LIORESAL) tablet 10 mg  10 mg Oral TID Laverle Hobby, PA-C   10 mg at 02/19/17 4854  . DULoxetine (CYMBALTA) DR capsule 40 mg  40 mg Oral Daily Ambrose Finland, MD   40 mg at 02/19/17 6270  . hydrOXYzine (ATARAX/VISTARIL) tablet 25 mg  25 mg Oral TID PRN Hughie Closs A, NP   25 mg at 02/18/17 2111  . lidocaine (LIDODERM) 5 % 1 patch  1 patch Transdermal Q24H Laverle Hobby, PA-C   1 patch at 02/18/17 1959  . magnesium hydroxide (MILK OF MAGNESIA) suspension 30 mL  30 mL Oral Daily PRN Okonkwo, Justina A, NP      . memantine (NAMENDA) tablet 5 mg  5 mg Oral BID Patriciaann Clan E, PA-C   5 mg at 02/19/17 3500  . metFORMIN (GLUCOPHAGE) tablet 500 mg  500 mg Oral BID WC Patriciaann Clan E, PA-C   500 mg at 02/19/17 9381  . metoprolol tartrate (LOPRESSOR) tablet 50 mg  50 mg Oral BID Laverle Hobby, PA-C   50 mg at 02/19/17 8299  . mometasone-formoterol (DULERA) 200-5 MCG/ACT inhaler 2 puff  2 puff Inhalation BID Patriciaann Clan E, PA-C      . nicotine (NICODERM CQ - dosed in mg/24 hours) patch 21 mg  21 mg Transdermal Daily Kerrie Buffalo, NP   21 mg at 02/19/17 0826  . pantoprazole  (PROTONIX) EC tablet 40 mg  40 mg Oral Daily Laverle Hobby, PA-C   40 mg at 02/19/17 3716  . pravastatin (PRAVACHOL) tablet 10 mg  10 mg Oral Daily Laverle Hobby, PA-C   10 mg at 02/19/17 9678  . traZODone (DESYREL) tablet 50 mg  50 mg Oral QHS PRN Lu Duffel, Justina A, NP   50 mg at 02/18/17 2111   PTA Medications: Prescriptions Prior to Admission  Medication Sig Dispense Refill Last Dose  . amLODipine (NORVASC) 10 MG tablet Take 10 mg by mouth daily.   Past Week at Unknown time  . baclofen (LIORESAL) 10 MG tablet Take 10 mg by mouth 3 (three) times daily.   Past Week at Unknown time  . diclofenac sodium (VOLTAREN) 1 % GEL Apply 2 g topically 4 (four) times daily as needed. 100 g 0 Past Week at Unknown time  . DULoxetine (CYMBALTA) 20 MG capsule Take 20 mg by mouth daily.   Past Week at Unknown time  . DULoxetine (CYMBALTA) 30 MG capsule Take 30 mg by mouth daily.   Past Week at Unknown time  . esomeprazole (NEXIUM) 40 MG capsule Take 40 mg by mouth daily at 12 noon.   Past Week at Unknown time  . fluticasone-salmeterol (ADVAIR HFA) 115-21  MCG/ACT inhaler Inhale 2 puffs into the lungs 2 (two) times daily.   Past Week at Unknown time  . ibuprofen (ADVIL,MOTRIN) 800 MG tablet Take 1 tablet (800 mg total) by mouth every 8 (eight) hours as needed for mild pain. (Patient not taking: Reported on 02/17/2017) 30 tablet 0 Completed Course at Unknown time  . lidocaine (LIDODERM) 5 % Place 1 patch onto the skin daily. Remove & Discard patch within 12 hours or as directed by MD 30 patch 0 Past Week at Unknown time  . memantine (NAMENDA) 5 MG tablet Take 5 mg by mouth 2 (two) times daily.   Past Week at Unknown time  . metFORMIN (GLUCOPHAGE) 500 MG tablet Take by mouth 2 (two) times daily with a meal.   Past Week at Unknown time  . methylPREDNISolone (MEDROL DOSEPAK) 4 MG TBPK tablet Taper over 6 days. 21 tablet 0   . metoprolol (LOPRESSOR) 50 MG tablet Take 50 mg by mouth 2 (two) times daily.   Past Week  at Unknown time  . oxyCODONE-acetaminophen (PERCOCET/ROXICET) 5-325 MG per tablet Take 1-2 tablets by mouth every 6 (six) hours as needed for severe pain. (Patient not taking: Reported on 02/17/2017) 15 tablet 0 Completed Course at Unknown time  . pravastatin (PRAVACHOL) 10 MG tablet Take 10 mg by mouth daily.     . Pregabalin (LYRICA PO) Take by mouth.   Past Week at Unknown time  . promethazine (PHENERGAN) 25 MG tablet Take 25 mg by mouth every 6 (six) hours as needed for nausea or vomiting.   Past Week at Unknown time  . venlafaxine (EFFEXOR) 37.5 MG tablet Take 37.5 mg by mouth 2 (two) times daily.   Past Week at Unknown time    Patient Stressors: Financial difficulties Health problems Loss of friends mother Marital or family conflict Substance abuse  Patient Strengths: Ability for insight Average or above average Architect for treatment/growth Supportive family/friends  Treatment Modalities: Medication Management, Group therapy, Case management,  1 to 1 session with clinician, Psychoeducation, Recreational therapy.   Physician Treatment Plan for Primary Diagnosis: MDD (major depressive disorder) Long Term Goal(s): Improvement in symptoms so as ready for discharge Improvement in symptoms so as ready for discharge   Short Term Goals: Ability to identify changes in lifestyle to reduce recurrence of condition will improve Ability to verbalize feelings will improve Ability to disclose and discuss suicidal ideas Ability to demonstrate self-control will improve Ability to identify and develop effective coping behaviors will improve Ability to maintain clinical measurements within normal limits will improve Compliance with prescribed medications will improve Ability to identify triggers associated with substance abuse/mental health issues will improve  Medication Management: Evaluate patient's response, side effects, and tolerance of medication  regimen.  Therapeutic Interventions: 1 to 1 sessions, Unit Group sessions and Medication administration.  Evaluation of Outcomes: Met  Physician Treatment Plan for Secondary Diagnosis: Principal Problem:   MDD (major depressive disorder) Active Problems:   Cocaine abuse with cocaine-induced mood disorder ([facility])  Long Term Goal(s): Improvement in symptoms so as ready for discharge Improvement in symptoms so as ready for discharge   Short Term Goals: Ability to identify changes in lifestyle to reduce recurrence of condition will improve Ability to verbalize feelings will improve Ability to disclose and discuss suicidal ideas Ability to demonstrate self-control will improve Ability to identify and develop effective coping behaviors will improve Ability to maintain clinical measurements within normal limits will improve Compliance with prescribed medications will improve  Ability to identify triggers associated with substance abuse/mental health issues will improve     Medication Management: Evaluate patient's response, side effects, and tolerance of medication regimen.  Therapeutic Interventions: 1 to 1 sessions, Unit Group sessions and Medication administration.  Evaluation of Outcomes: Met   RN Treatment Plan for Primary Diagnosis: MDD (major depressive disorder) Long Term Goal(s): Knowledge of disease and therapeutic regimen to maintain health will improve  Short Term Goals: Ability to remain free from injury will improve, Ability to verbalize feelings will improve and Ability to disclose and discuss suicidal ideas  Medication Management: RN will administer medications as ordered by provider, will assess and evaluate patient's response and provide education to patient for prescribed medication. RN will report any adverse and/or side effects to prescribing provider.  Therapeutic Interventions: 1 on 1 counseling sessions, Psychoeducation, Medication administration, Evaluate responses  to treatment, Monitor vital signs and CBGs as ordered, Perform/monitor CIWA, COWS, AIMS and Fall Risk screenings as ordered, Perform wound care treatments as ordered.  Evaluation of Outcomes: Met   LCSW Treatment Plan for Primary Diagnosis: MDD (major depressive disorder) Long Term Goal(s): Safe transition to appropriate next level of care at discharge, Engage patient in therapeutic group addressing interpersonal concerns.  Short Term Goals: Engage patient in aftercare planning with referrals and resources, Facilitate patient progression through stages of change regarding substance use diagnoses and concerns and Identify triggers associated with mental health/substance abuse issues  Therapeutic Interventions: Assess for all discharge needs, 1 to 1 time with Social worker, Explore available resources and support systems, Assess for adequacy in community support network, Educate family and significant other(s) on suicide prevention, Complete Psychosocial Assessment, Interpersonal group therapy.  Evaluation of Outcomes: Met   Progress in Treatment: Attending groups: No.  Participating in groups: No. Taking medication as prescribed: Yes. Toleration medication: Yes. Family/Significant other contact made: SPE completed with pt's daughter.  Patient understands diagnosis: Yes. Discussing patient identified problems/goals with staff: Yes. Medical problems stabilized or resolved: Yes. Denies suicidal/homicidal ideation: Yes Issues/concerns per patient self-inventory: No. Other: n/a   New problem(s) identified: No, Describe:  n/a  New Short Term/Long Term Goal(s): detox; elimination of AVH/SI thoughts, medication stabilization, and development of comprehensive mental wellness/sobriety plan.   Discharge Plan or Barriers: Return home with daughter; [facility] for follow-up.   Reason for Continuation of Hospitalization: none  Estimated Length of Stay: discharge today  Attendees: Patient: 02/19/2017  11:42 AM  Physician: Dr. Dwyane Dee MD 02/19/2017 11:42 AM  Nursing: Theodis Shove RN 02/19/2017 11:42 AM  RN Care Manager: Lars Pinks CM 02/19/2017 11:42 AM  Social Worker: Maxie Better, LCSW 02/19/2017 11:42 AM  Recreational Therapist: x 02/19/2017 11:42 AM  Other: Lindell Spar NP 02/19/2017 11:42 AM  Other:  02/19/2017 11:42 AM  Other: 02/19/2017 11:42 AM    Scribe for Treatment Team: [place], LCSW 02/19/2017 11:42 AM

## 2017-02-19 NOTE — [facility] Suicide Risk Assessment (Signed)
[facility]   Principal Problem: MDD (major depressive disorder) Discharge Diagnoses:  Patient Active Problem List   Diagnosis Date Noted  . Cocaine abuse with cocaine-induced mood disorder ([facility]) [F14.14] 02/18/2017  . MDD (major depressive disorder) [F32.9] 02/17/2017    Total Time spent with patient: 30 minutes  Musculoskeletal: Strength & Muscle Tone: decreased Gait & Station: unable to stand Patient leans: N/A  Psychiatric Specialty Exam: ROS  Blood pressure 123/89, pulse (!) 116, temperature 98.2 F (36.8 C), temperature source Oral, resp. rate 20, height 5\' 4"  (1.626 m), weight 68 kg (150 lb), SpO2 100 %.Body mass index is 25.75 kg/m.  General Appearance: Casual  Eye Contact::  Good  Speech:  Clear and Coherent and Slow  Volume:  Decreased  Mood:  Euthymic and Much better and feels relaxed.  Affect:  Constricted and Depressed  Thought Process:  Coherent and Goal Directed  Orientation:  Full (Time, Place, and Person)  Thought Content:  WDL  Suicidal Thoughts:  No  Homicidal Thoughts:  No  Memory:  Immediate;   Good Recent;   Fair Remote;   Fair  Judgement:  Intact  Insight:  Fair  Psychomotor Activity:  Decreased and sciatic pain and uses walker or cane at home.  Concentration:  Good  Recall:  Good  Fund of Knowledge:Good  Language: Good  Akathisia:  Negative  Handed:  Right  AIMS (if indicated):     Assets:  Communication Skills Desire for Improvement Financial Resources/Insurance Housing Leisure Time Physical Health Resilience Social Support Talents/Skills Transportation  Sleep:  Number of Hours: 6.5  Cognition: WNL  ADL's:  Intact   Mental Status Per Nursing Assessment::   On Admission:  Suicidal ideation indicated by patient, Self-harm thoughts, Self-harm behaviors  Demographic Factors:  Adolescent or young adult, Low socioeconomic status and lives with her daughter and two grandchildren.  Loss Factors: Decrease in  vocational status and Decline in physical health  Historical Factors: NA  Risk Reduction Factors:   Sense of responsibility to family, Religious beliefs about death, Living with another person, especially a relative, Positive social support, Positive therapeutic relationship and Positive coping skills or problem solving skills  Continued Clinical Symptoms:  Depression:   Anhedonia Recent sense of peace/wellbeing Alcohol/Substance Abuse/Dependencies Previous Psychiatric Diagnoses and Treatments  Cognitive Features That Contribute To Risk:  Polarized thinking    Suicide Risk:  Minimal: No identifiable suicidal ideation.  Patients presenting with no risk factors but with morbid ruminations; may be classified as minimal risk based on the severity of the depressive symptoms  Follow-up Information    [facility]  Follow up.   Why:  [facility]t for follow-up appointment. She will call you directly with appointment time and date or you can walk in between 9am-11am Monday through Friday to be seen for hospital follow-up. Thank you! Contact information: [address] [place] [address] [phone]           Plan Of Care/Follow-up recommendations:  Activity:  As tolerated Diet:  Regular  Leata MouseJANARDHANA Caisley Baxend[place], MD 02/19/2017, 11:43 AM

## 2017-02-19 NOTE — Discharge Summary (Signed)
Physician Discharge Summary Note  Patient:  Judy Schmidt is an 51 y.o., female MRN:  [id number]  DOB:  11/03/1965  Patient phone:  [phone] (home)   Patient address:   [address] [place] [address],   Total Time spent with patient: Greater than 30 minutes  Date of Admission:  02/17/2017 Date of Discharge: 02-19-17  Reason for Admission: Worsening depression triggering suicidal ideations & Auditory/visual hallucinations.  Principal Problem: MDD (major depressive disorder)  Discharge Diagnoses: Patient Active Problem List   Diagnosis Date Noted  . Cocaine abuse with cocaine-induced mood disorder (HCC) [F14.14] 02/18/2017  . MDD (major depressive disorder) [F32.9] 02/17/2017   Past Psychiatric History: Cocaine use disorder, Major dperession.  Past Medical History:  Past Medical History:  Diagnosis Date  . Bulging lumbar disc   . COPD (chronic obstructive pulmonary disease) (HCC)   . Depression   . Diabetes mellitus without complication (HCC)   . Gall stones   . High cholesterol   . Hyperlipemia   . Hypertension   . Sciatica   . Thyroid disease     Past Surgical History:  Procedure Laterality Date  . HERNIA REPAIR    . KNEE SURGERY    . TUBAL LIGATION     Family History: History reviewed. No pertinent family history.  Family Psychiatric  History: See H&P  Social History:  History  Alcohol Use  . 0.5 oz/week  . 1 Standard drinks or equivalent per week    Comment: RARE     History  Drug Use No    Social History   Social History  . Marital status: Single    Spouse name: N/A  . Number of children: N/A  . Years of education: N/A   Social History Main Topics  . Smoking status: Current Every Day Smoker    Packs/day: 0.50    Types: Cigarettes  . Smokeless tobacco: Never Used  . Alcohol use 0.5 oz/week    1 Standard drinks or equivalent per week     Comment: RARE  . Drug use: No  . Sexual activity: Not Asked   Other Topics Concern  .  None   Social History Narrative  . None   Hospital Course: (Per Admission assessment):  Judy Schmidt an 51 y.o.female seen, chart reviewed and she stayed in her room due to increased left leg pain from hip to bottom of her foot. She reports recently relapsed on cocaine and got into strained relationship with her daughter and increased symptoms of depression, anxiety and suicide idation. She endorses auditory and visual hallucinations. She is seeing dark shadows, people and hear people talking but can not tell what she hears.   Judy Schmidt's stay in this hospital was rather very brief. She was admitted to the hospital with her UDS positive for cocaine. She was also complaining of suicidal ideations & AVH. She admitted having relapsed on cocaine a month ago after months of sobriety. She was admitted for mood stabilization treatments. After her admission assessment, it was determined that Judy Schmidt will be started om medication regimen for her presenting symptoms. She was medicated & discharged on; Duloxetine 40 mg for depression, Hydroxyzine 25 mg prn for anxiety, Namenda 5 mg for memory issues, Nicotine patch 21 mg prescription for smoking ceasation & Trazodone 50 mg for insomnia. Her other pertinent home medications for her other pre-existing medical issues were re-started. She was enrolled & briefly attended/participated in the group counseling sessions being offered & held on this unit. She learned  coping skills.   During the follow-up care assessment this morning, Judy Schmidt presented with a good affect, good eye contact, is alert & oriented x 3. She is aware of situation & able to make concrete decisions/requests. She has asked to be discharged today. She is mentally & medically stable. She says she already has an outpatient psychiatric care established with [facility] in [place], [place]. She says she has a good relationship with her daughter whom she lived with & will continue that relationship as long as she  does not engage in cocaine or any other illegal drug use. She denies any SIHI, plans or intent. She denies any AVH, delusional thoughts or paranoia. And because there is no clinical criteria to keep Judy Schmidt admitted to the hospital, she is currently being discharged as requested to her place of residence. She is committed to abstaining from substances.   Upon discharge, Judy Schmidt appears much more in control of her mood & behavior. Her symptoms were reported as significantly improved or completely resolved There are currently, no active SI plans or intent, AVH, delusional thoughts or paranoia. She is going to pursue outpatient treatment in her own terms with the [facility] as noted below. She was provided with prescriptions. She left [facility] with all personal belongings in no apparent distress. Transportation per daughter.  Physical Findings: AIMS: Facial and Oral Movements Muscles of Facial Expression: None, normal Lips and Perioral Area: None, normal Jaw: None, normal Tongue: None, normal,Extremity Movements Upper (arms, wrists, hands, fingers): None, normal Lower (legs, knees, ankles, toes): None, normal, Trunk Movements Neck, shoulders, hips: None, normal, Overall Severity Severity of abnormal movements (highest score from questions above): None, normal Incapacitation due to abnormal movements: None, normal Patient's awareness of abnormal movements (rate only patient's report): No Awareness, Dental Status Current problems with teeth and/or dentures?: No Does patient usually wear dentures?: No  CIWA:    COWS:     Musculoskeletal: Strength & Muscle Tone: within normal limits Gait & Station: normal Patient leans: N/A  Psychiatric Specialty Exam: Physical Exam  Constitutional: She is oriented to person, place, and time. She appears well-developed.  HENT:  Head: Normocephalic.  Eyes: Pupils are equal, round, and reactive to light.  Neck: Normal range of motion.  Cardiovascular:  Elevated pulse  pressure  Respiratory: Effort normal.  GI: Soft.  Genitourinary:  Genitourinary Comments: Deferred  Musculoskeletal: Normal range of motion.  Neurological: She is alert and oriented to person, place, and time.  Skin: Skin is warm and dry.    Review of Systems  Constitutional: Negative.   HENT: Negative.   Eyes: Negative.   Respiratory: Negative.   Cardiovascular: Negative.   Gastrointestinal: Negative.   Genitourinary: Negative.   Musculoskeletal: Negative.   Skin: Negative.   Neurological: Negative.   Endo/Heme/Allergies: Negative.   Psychiatric/Behavioral: Positive for depression (Stable) and substance abuse (Hx, Cocaine use disorder). Negative for hallucinations, memory loss and suicidal ideas. The patient has insomnia (Stable). The patient is not nervous/anxious.     Blood pressure 123/89, pulse (!) 116, temperature 98.2 F (36.8 C), temperature source Oral, resp. rate 20, height 5\' 4"  (1.626 m), weight 68 kg (150 lb), SpO2 100 %.Body mass index is 25.75 kg/m.  See Md's SRA   Have you used any form of tobacco in the last 30 days? (Cigarettes, Smokeless Tobacco, Cigars, and/or Pipes): Yes  Has this patient used any form of tobacco in the last 30 days? (Cigarettes, Smokeless Tobacco, Cigars, and/or Pipes):Yes, provided with a nicotine patch prescription  upon discharge.   Blood Alcohol level:  Lab Results  Component Value Date   ETH <5 02/17/2017    Metabolic Disorder Labs:  No results found for: HGBA1C, MPG No results found for: PROLACTIN No results found for: CHOL, TRIG, HDL, CHOLHDL, VLDL, LDLCALC  See Psychiatric Specialty Exam and Suicide Risk Assessment completed by Attending Physician prior to discharge.  Discharge destination:  Home  Is patient on multiple antipsychotic therapies at discharge:  No   Has Patient had three or more failed trials of antipsychotic monotherapy by history:  No  Recommended Plan for Multiple Antipsychotic Therapies: NA Discharge  Instructions    Diet - low sodium heart healthy    Complete by:  As directed      Allergies as of 02/19/2017      Reactions   Asa [aspirin]    Bactrim [sulfamethoxazole-trimethoprim]    Flagyl [metronidazole]    Nsaids Other (See Comments)   ulcer   Plavix [clopidogrel Bisulfate]    Sulfa Antibiotics    Sulfamethoxazole Rash      Medication List    STOP taking these medications   diclofenac sodium 1 % Gel Commonly known as:  VOLTAREN   ibuprofen 800 MG tablet Commonly known as:  ADVIL,MOTRIN   LYRICA PO   methylPREDNISolone 4 MG Tbpk tablet Commonly known as:  MEDROL DOSEPAK   oxyCODONE-acetaminophen 5-325 MG tablet Commonly known as:  PERCOCET/ROXICET   promethazine 25 MG tablet Commonly known as:  PHENERGAN   venlafaxine 37.5 MG tablet Commonly known as:  EFFEXOR     TAKE these medications     Indication  amLODipine 10 MG tablet Commonly known as:  NORVASC Take 1 tablet (10 mg total) by mouth daily. For high blood pressure What changed:  additional instructions  Indication:  High Blood Pressure Disorder   baclofen 10 MG tablet Commonly known as:  LIORESAL Take 1 tablet (10 mg total) by mouth 3 (three) times daily. For muscle spasms What changed:  additional instructions  Indication:  Muscle Spasticity   DULoxetine HCl 40 MG Cpep Take 40 mg by mouth daily. For depression What changed:  medication strength  how much to take  additional instructions  Another medication with the same name was removed. Continue taking this medication, and follow the directions you see here.  Indication:  Major Depressive Disorder   esomeprazole 40 MG capsule Commonly known as:  NEXIUM Take 1 capsule (40 mg total) by mouth daily at 12 noon. For acid reflux What changed:  additional instructions  Indication:  Gastroesophageal Reflux Disease   fluticasone-salmeterol 115-21 MCG/ACT inhaler Commonly known as:  ADVAIR HFA Inhale 2 puffs into the lungs 2 (two) times  daily. For shortness of breath What changed:  additional instructions  Indication:  Asthma   hydrOXYzine 25 MG tablet Commonly known as:  ATARAX/VISTARIL Take 1 tablet (25 mg total) by mouth 3 (three) times daily as needed for anxiety.  Indication:  Anxiety Neurosis   lidocaine 5 % Commonly known as:  LIDODERM Place 1 patch onto the skin daily. Remove & Discard patch within 12 hours or as directed by MD: Pain management What changed:  additional instructions  Indication:  Pain   memantine 5 MG tablet Commonly known as:  NAMENDA Take 1 tablet (5 mg total) by mouth 2 (two) times daily. 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 For diabetes What changed:  how much to take  additional instructions  Indication:  Type 2 Diabetes   metoprolol tartrate 50 MG tablet Commonly known as:  LOPRESSOR Take 1 tablet (50 mg total) by mouth 2 (two) times daily. For high blood pressure What changed:  additional instructions  Indication:  High Blood Pressure Disorder   nicotine 21 mg/24hr patch Commonly known as:  NICODERM CQ - dosed in mg/24 hours Place 1 patch (21 mg total) onto the skin daily. For smoking cessation  Indication:  Nicotine Addiction   pravastatin 10 MG tablet Commonly known as:  PRAVACHOL Take 1 tablet (10 mg total) by mouth daily. For high cholesterol What changed:  additional instructions  Indication:  Inherited Heterozygous Hypercholesterolemia, High Amount of Fats in the Blood   traZODone 50 MG tablet Commonly known as:  DESYREL Take 1 tablet (50 mg total) by mouth at bedtime as needed for sleep.  Indication:  Trouble Sleeping      Follow-up Information    [facility] Follow up on 02/23/2017.   Why:  Arrive by 1pm for 1:30PM appt in order to complete  paperwork. Please bring Medicaid card and ID if you have it. Thank you.  Contact information: [address] [place] [address] [phone]          Follow-up recommendations: Activity:  As tolerated Diet: As recommended by your primary care doctor. Keep all scheduled follow-up appointments as recommended.   Comments: Patient is instructed prior to discharge to: Take all medications as prescribed by his/her mental healthcare provider. Report any adverse effects and or reactions from the medicines to his/her outpatient provider promptly. Patient has been instructed & cautioned: To not engage in alcohol and or illegal drug use while on prescription medicines. In the event of worsening symptoms, patient is instructed to call the crisis hotline, 911 and or go to the nearest ED for appropriate evaluation and treatment of symptoms. To follow-up with his/her primary care provider for your other medical issues, concerns and or health care needs.   Signed: Sanjuana Kava, NP, PMHNP, FNP-BC 02/20/2017, 11:41 AM   Patient seen, chart reviewed and case discussed with treatment team, physician extender and completed discharge suicide risk assessment and completed safe disposition plan. Reviewed the information documented and agree with the treatment plan.  Judy Schmidt 02/20/2017 4:07 PM

## 2017-02-19 NOTE — [facility] Suicide Risk Assessment (Signed)
[facility] INPATIENT:  Family/Significant Other Suicide Prevention Education  Suicide Prevention Education:  Education Completed; Judy Schmidt (pt's daughter) [phone] has been identified by the patient as the family member/significant other with whom the patient will be residing, and identified as the person(s) who will aid the patient in the event of a mental health crisis (suicidal ideations/suicide attempt).  With written consent from the patient, the family member/significant other has been provided the following suicide prevention education, prior to the and/or following the discharge of the patient.  The suicide prevention education provided includes the following:  Suicide risk factors  Suicide prevention and interventions  National Suicide Hotline telephone number  [facility] assessment telephone number  [facility] Emergency Assistance 911  [facility] and/or Residential Mobile Crisis Unit telephone number  Request made of family/significant other to:  Remove weapons (e.g., guns, rifles, knives), all items previously/currently identified as safety concern.    Remove drugs/medications (over-the-counter, prescriptions, illicit drugs), all items previously/currently identified as a safety concern.  The family member/significant other verbalizes understanding of the suicide prevention education information provided.  The family member/significant other agrees to remove the items of safety concern listed above.  Judy Amberg N Smart LCSW 02/19/2017, 11:35 AM

## 2017-02-19 NOTE — Progress Notes (Signed)
D   Pt continues to do some significant moaning and having trouble positioning herself in the bed due to her pain   She was in bed most of the shift due to her pain as well   She is cooperative and compliant  A    Verbal support given   Medications educated on and administered   Effectiveness evaluated   Put top siderails up so patient can use her arms to pull herself up and change positions   Q 15 min checks R   Pt is safe and resting quietly

## 2017-02-19 NOTE — Progress Notes (Signed)
[facility] Group Notes:  (Nursing/MHT/Case Management/Adjunct)  Date:  02/19/2017  Time:  0930   Type of Therapy:  Nurse Education  Participation Level:  Active  Participation Quality:  Appropriate  Affect:  Anxious and Depressed  Cognitive:  Alert and Oriented  Insight:  Improving  Engagement in Group:  Engaged  Modes of Intervention:  Activity, Discussion, Education, Socialization and Support  Summary of Progress/Problems:The purpose of this group is to discuss the benefits and uses of aromatherapy. Pt participated appropriately in group.  Beatrix ShipperWright, Gonzalo Waymire Martin 02/19/2017, 3:14 PM

## 2017-02-19 NOTE — Progress Notes (Signed)
  [facility] Discharge Plan :  Will you be returning to the same living situation after discharge:  Yes,  home At discharge, do you have transportation home?: Yes,  daughter coming at 3pm Do you have the ability to pay for your medications: Yes,  [facility]  Release of information consent forms completed and submitted to medical records by CSW.  Patient to Follow up at: Follow-up Information    [facility] Follow up.   Why:  [facility]t for follow-up appointment. She will call you directly with appointment time and date or you can walk in between 9am-11am Monday through Friday to be seen for hospital follow-up. Thank you! Contact information: [address] [place] [address] [phone]           Next level of care provider has access to [facility] Link:no  Safety Planning and Suicide Prevention discussed: Yes,  SPE completed with pt and her daughter  Have you used any form of tobacco in the last 30 days? (Cigarettes, Smokeless Tobacco, Cigars, and/or Pipes): Yes  Has patient been referred to the Quitline?: Patient refused referral  Patient has been referred for addiction treatment: Yes  Patton Rabinovich N Smart LCSW 02/19/2017, 11:41 AM

## 2017-02-19 NOTE — Progress Notes (Signed)
Pt d/c from the hospital with her daughter. All items returned. D/C instructions given and prescriptions given. Pt denies si and hi. 
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
## 2017-03-26 ENCOUNTER — Encounter (HOSPITAL_BASED_OUTPATIENT_CLINIC_OR_DEPARTMENT_OTHER): Payer: Self-pay | Admitting: Emergency Medicine

## 2017-03-26 ENCOUNTER — Emergency Department (HOSPITAL_BASED_OUTPATIENT_CLINIC_OR_DEPARTMENT_OTHER)
Admission: EM | Admit: 2017-03-26 | Discharge: 2017-03-26 | Disposition: A | Discharge status: Home / Self Care | Payer: Medicaid Other | Attending: Emergency Medicine | Admitting: Emergency Medicine

## 2017-03-26 DIAGNOSIS — F17210 Nicotine dependence, cigarettes, uncomplicated: Secondary | ICD-10-CM | POA: Insufficient documentation

## 2017-03-26 DIAGNOSIS — E119 Type 2 diabetes mellitus without complications: Secondary | ICD-10-CM | POA: Diagnosis not present

## 2017-03-26 DIAGNOSIS — Z7984 Long term (current) use of oral hypoglycemic drugs: Secondary | ICD-10-CM | POA: Insufficient documentation

## 2017-03-26 DIAGNOSIS — M79672 Pain in left foot: Secondary | ICD-10-CM | POA: Diagnosis present

## 2017-03-26 DIAGNOSIS — I10 Essential (primary) hypertension: Secondary | ICD-10-CM | POA: Diagnosis not present

## 2017-03-26 DIAGNOSIS — W57XXXA Bitten or stung by nonvenomous insect and other nonvenomous arthropods, initial encounter: Secondary | ICD-10-CM | POA: Insufficient documentation

## 2017-03-26 DIAGNOSIS — J449 Chronic obstructive pulmonary disease, unspecified: Secondary | ICD-10-CM | POA: Diagnosis not present

## 2017-03-26 DIAGNOSIS — Z7902 Long term (current) use of antithrombotics/antiplatelets: Secondary | ICD-10-CM | POA: Insufficient documentation
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
## 2017-03-26 NOTE — ED Provider Notes (Signed)
[facility] Provider Note   CSN: [id number] Arrival date & time: 03/26/17  1125     History   Chief Complaint Chief Complaint  Patient presents with  . Leg Pain  . Tick Removal    HPI Judy Schmidt is a 51 y.o. female. Patient is a 51 year old female who states that a tick was embedded in her left lateral foot today.  She removed it but is still having some discomfort and pain at the area.  No fevers or chills.  No swelling of the foot.  No erythema.   The history is provided by the patient.    Past Medical History:  Diagnosis Date  . Bulging lumbar disc   . COPD (chronic obstructive pulmonary disease) (HCC)   . Depression   . Diabetes mellitus without complication (HCC)   . Gall stones   . High cholesterol   . Hyperlipemia   . Hypertension   . Sciatica   . Thyroid disease     Patient Active Problem List   Diagnosis Date Noted  . Cocaine abuse with cocaine-induced mood disorder (HCC) 02/18/2017  . MDD (major depressive disorder) 02/17/2017    Past Surgical History:  Procedure Laterality Date  . HERNIA REPAIR    . KNEE SURGERY    . TUBAL LIGATION      OB History    No data available       Home Medications    Prior to Admission medications   Medication Sig Start Date End Date Taking? Authorizing Provider  amLODipine (NORVASC) 10 MG tablet Take 1 tablet (10 mg total) by mouth daily. For high blood pressure 02/19/17   Nwoko, Nicole Kindred I, NP  baclofen (LIORESAL) 10 MG tablet Take 1 tablet (10 mg total) by mouth 3 (three) times daily. For muscle spasms 02/19/17   Armandina Stammer I, NP  DULoxetine 40 MG CPEP Take 40 mg by mouth daily. For depression 02/20/17   Armandina Stammer I, NP  esomeprazole (NEXIUM) 40 MG capsule Take 1 capsule (40 mg total) by mouth daily at 12 noon. For acid reflux 02/19/17   Armandina Stammer I, NP  fluticasone-salmeterol (ADVAIR HFA) 115-21 MCG/ACT inhaler Inhale 2 puffs into the lungs 2 (two) times daily. For shortness of breath 02/19/17    Armandina Stammer I, NP  hydrOXYzine (ATARAX/VISTARIL) 25 MG tablet Take 1 tablet (25 mg total) by mouth 3 (three) times daily as needed for anxiety. 02/19/17   Armandina Stammer I, NP  lidocaine (LIDODERM) 5 % Place 1 patch onto the skin daily. Remove & Discard patch within 12 hours or as directed by MD: Pain management 02/19/17   Armandina Stammer I, NP  memantine (NAMENDA) 5 MG tablet Take 1 tablet (5 mg total) by mouth 2 (two) times daily. For memory problems 02/19/17   Armandina Stammer I, NP  metFORMIN (GLUCOPHAGE) 500 MG tablet Take 1 tablet (500 mg total) by mouth 2 (two) times daily with a meal. For diabetes 02/19/17   Armandina Stammer I, NP  metoprolol tartrate (LOPRESSOR) 50 MG tablet Take 1 tablet (50 mg total) by mouth 2 (two) times daily. For high blood pressure 02/19/17   Nwoko, Nicole Kindred I, NP  nicotine (NICODERM CQ - DOSED IN MG/24 HOURS) 21 mg/24hr patch Place 1 patch (21 mg total) onto the skin daily. For smoking cessation 02/20/17   Armandina Stammer I, NP  pravastatin (PRAVACHOL) 10 MG tablet Take 1 tablet (10 mg total) by mouth daily. For high cholesterol 02/19/17   Armandina Stammer I,  NP  traZODone (DESYREL) 50 MG tablet Take 1 tablet (50 mg total) by mouth at bedtime as needed for sleep. 02/19/17   Sanjuana KavaNwoko, Agnes I, NP    Family History History reviewed. No pertinent family history.  Social History Social History  Substance Use Topics  . Smoking status: Current Every Day Smoker    Packs/day: 0.50    Types: Cigarettes  . Smokeless tobacco: Never Used  . Alcohol use 0.5 oz/week    1 Standard drinks or equivalent per week     Comment: RARE     Allergies   Asa [aspirin]; Bactrim [sulfamethoxazole-trimethoprim]; Flagyl [metronidazole]; Nsaids; Plavix [clopidogrel bisulfate]; Sulfa antibiotics; and Sulfamethoxazole   Review of Systems Review of Systems  All other systems reviewed and are negative.    Physical Exam Updated Vital Signs BP (!) 146/118   Pulse 71   Temp 98.3 F (36.8 C) (Oral)   Resp 18    Ht 5\' 2"  (1.575 m)   Wt 59 kg (130 lb)   SpO2 100%   BMI 23.78 kg/m   Physical Exam  Constitutional: She is oriented to person, place, and time. She appears well-developed and well-nourished.  HENT:  Head: Normocephalic.  Eyes: EOM are normal.  Neck: Normal range of motion.  Pulmonary/Chest: Effort normal.  Abdominal: She exhibits no distension.  Musculoskeletal: Normal range of motion.  Stigmata of recent tick bite of the left lateral foot without surrounding erythema or swelling.  Normal PT and DP pulse left foot.  Compartments soft  Neurological: She is alert and oriented to person, place, and time.  Psychiatric: She has a normal mood and affect.  Nursing note and vitals reviewed.    ED Treatments / Results  Labs (all labs ordered are listed, but only abnormal results are displayed) Labs Reviewed - No data to display  EKG  EKG Interpretation None       Radiology No results found.  Procedures Procedures (including critical care time)  Medications Ordered in ED Medications - No data to display   Initial Impression / Assessment and Plan / ED Course  I have reviewed the triage vital signs and the nursing notes.  Pertinent labs & imaging results that were available during my care of the patient were reviewed by me and considered in my medical decision making (see chart for details).    Patient remove the tick Foley.  She's having some discomfort.  His no signs of infection at this time.  Patient understands return to the ER for new or worsening symptoms  Final Clinical Impressions(s) / ED Diagnoses   Final diagnoses:  Tick bite, initial encounter    New Prescriptions New Prescriptions   No medications on file     Azalia Bilisampos, Corryn Madewell, MD 03/26/17 1205

## 2017-07-05 IMAGING — DX DG HIP (WITH OR WITHOUT PELVIS) 2-3V*R*
3 series · 3 of 3 positions shown · non-contrast
Comparison: None.

CLINICAL DATA: Right hip pain for 1 week after fall.

EXAM:
DG HIP (WITH OR WITHOUT PELVIS) 2-3V RIGHT

[pelvis ap]
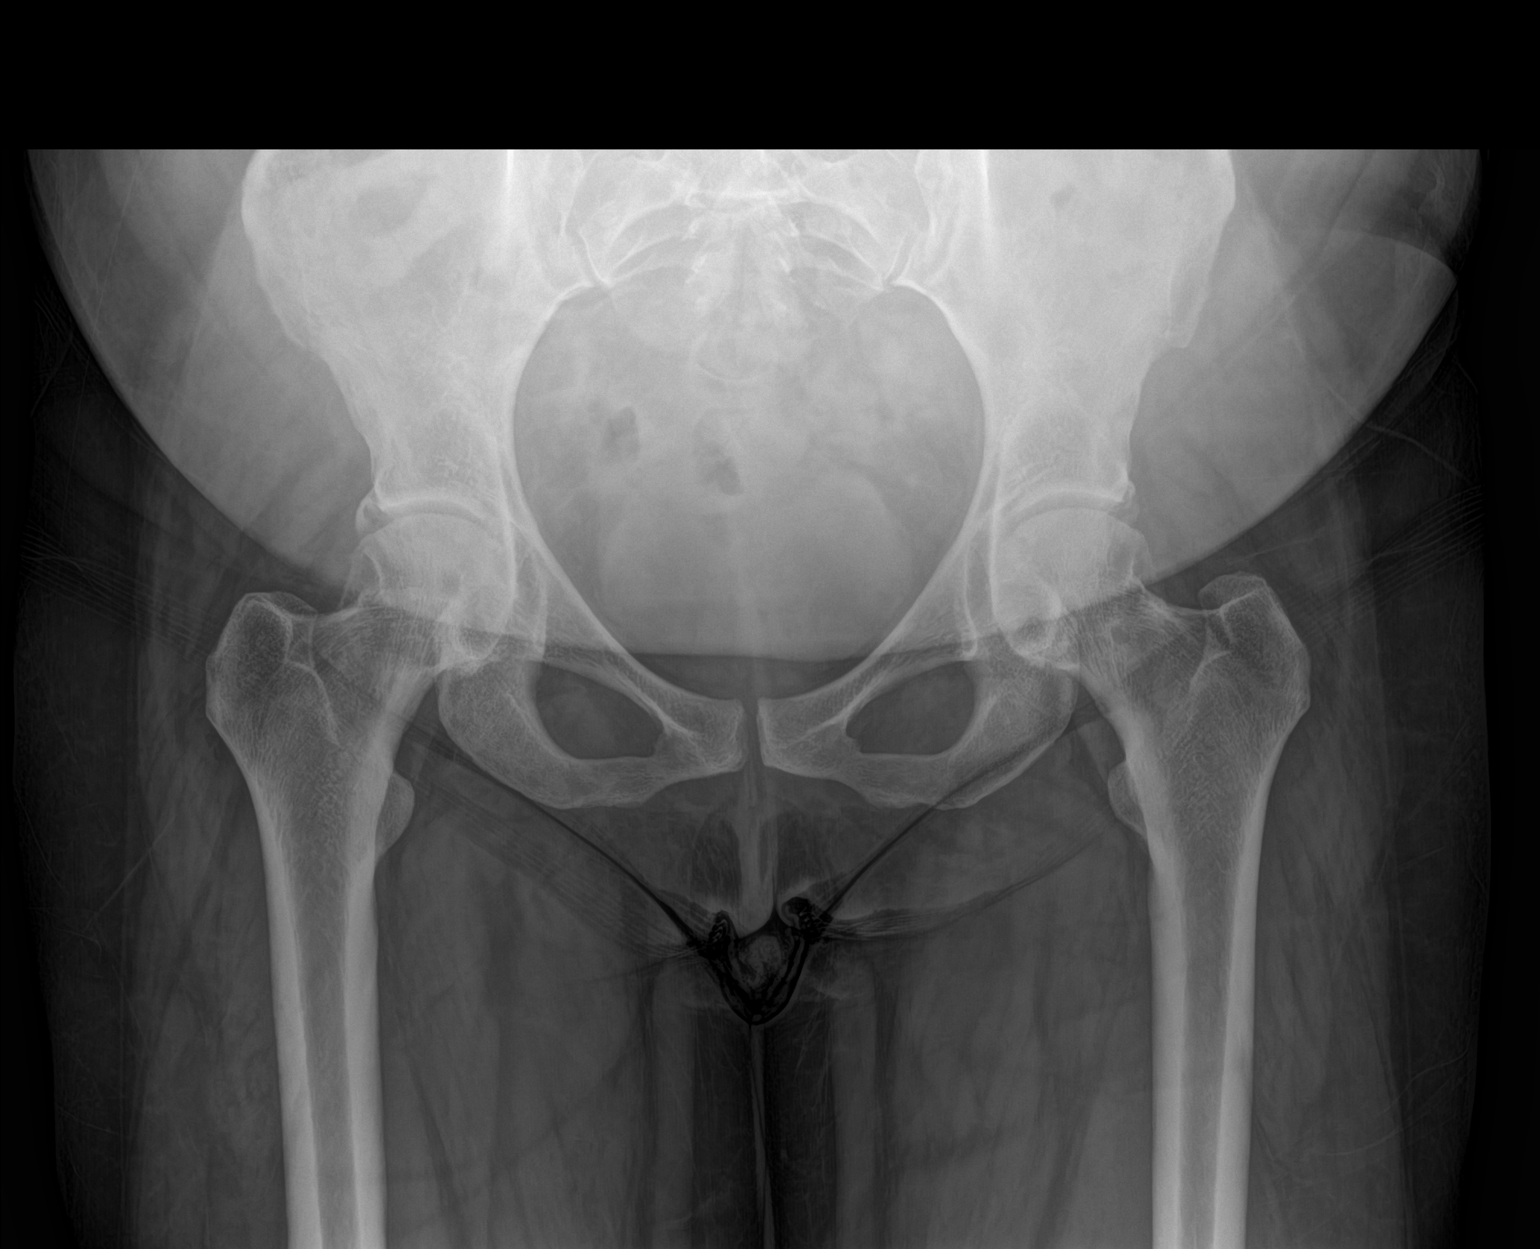

[hip ap]
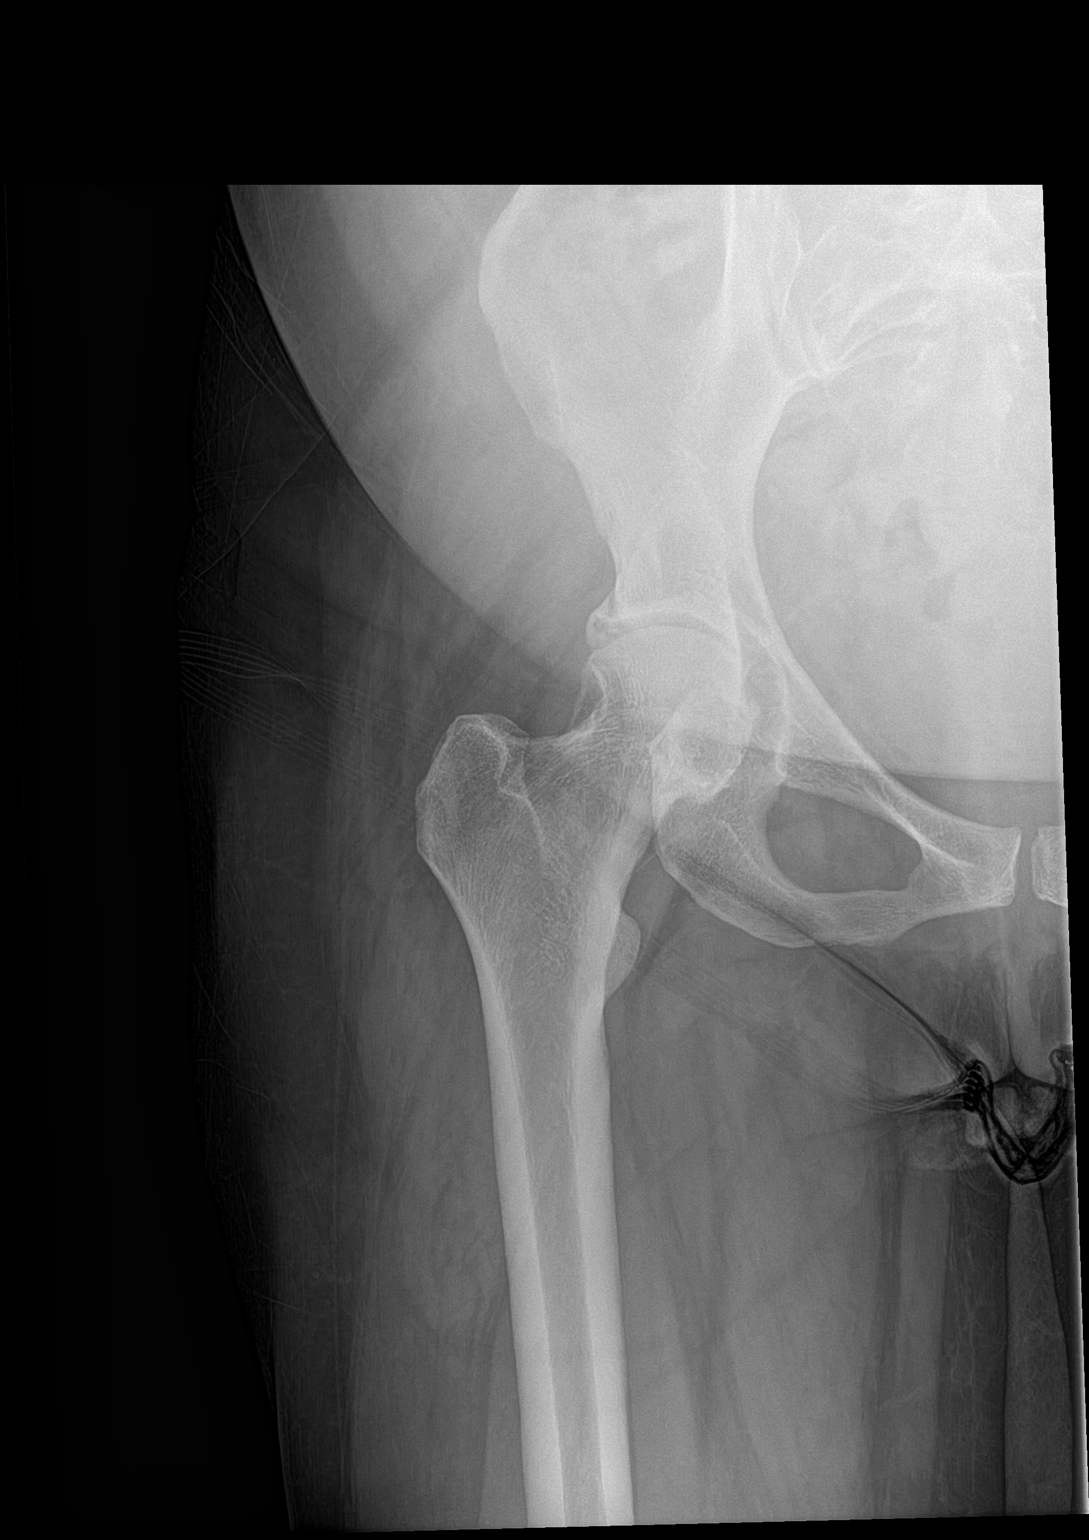

[hip lat]
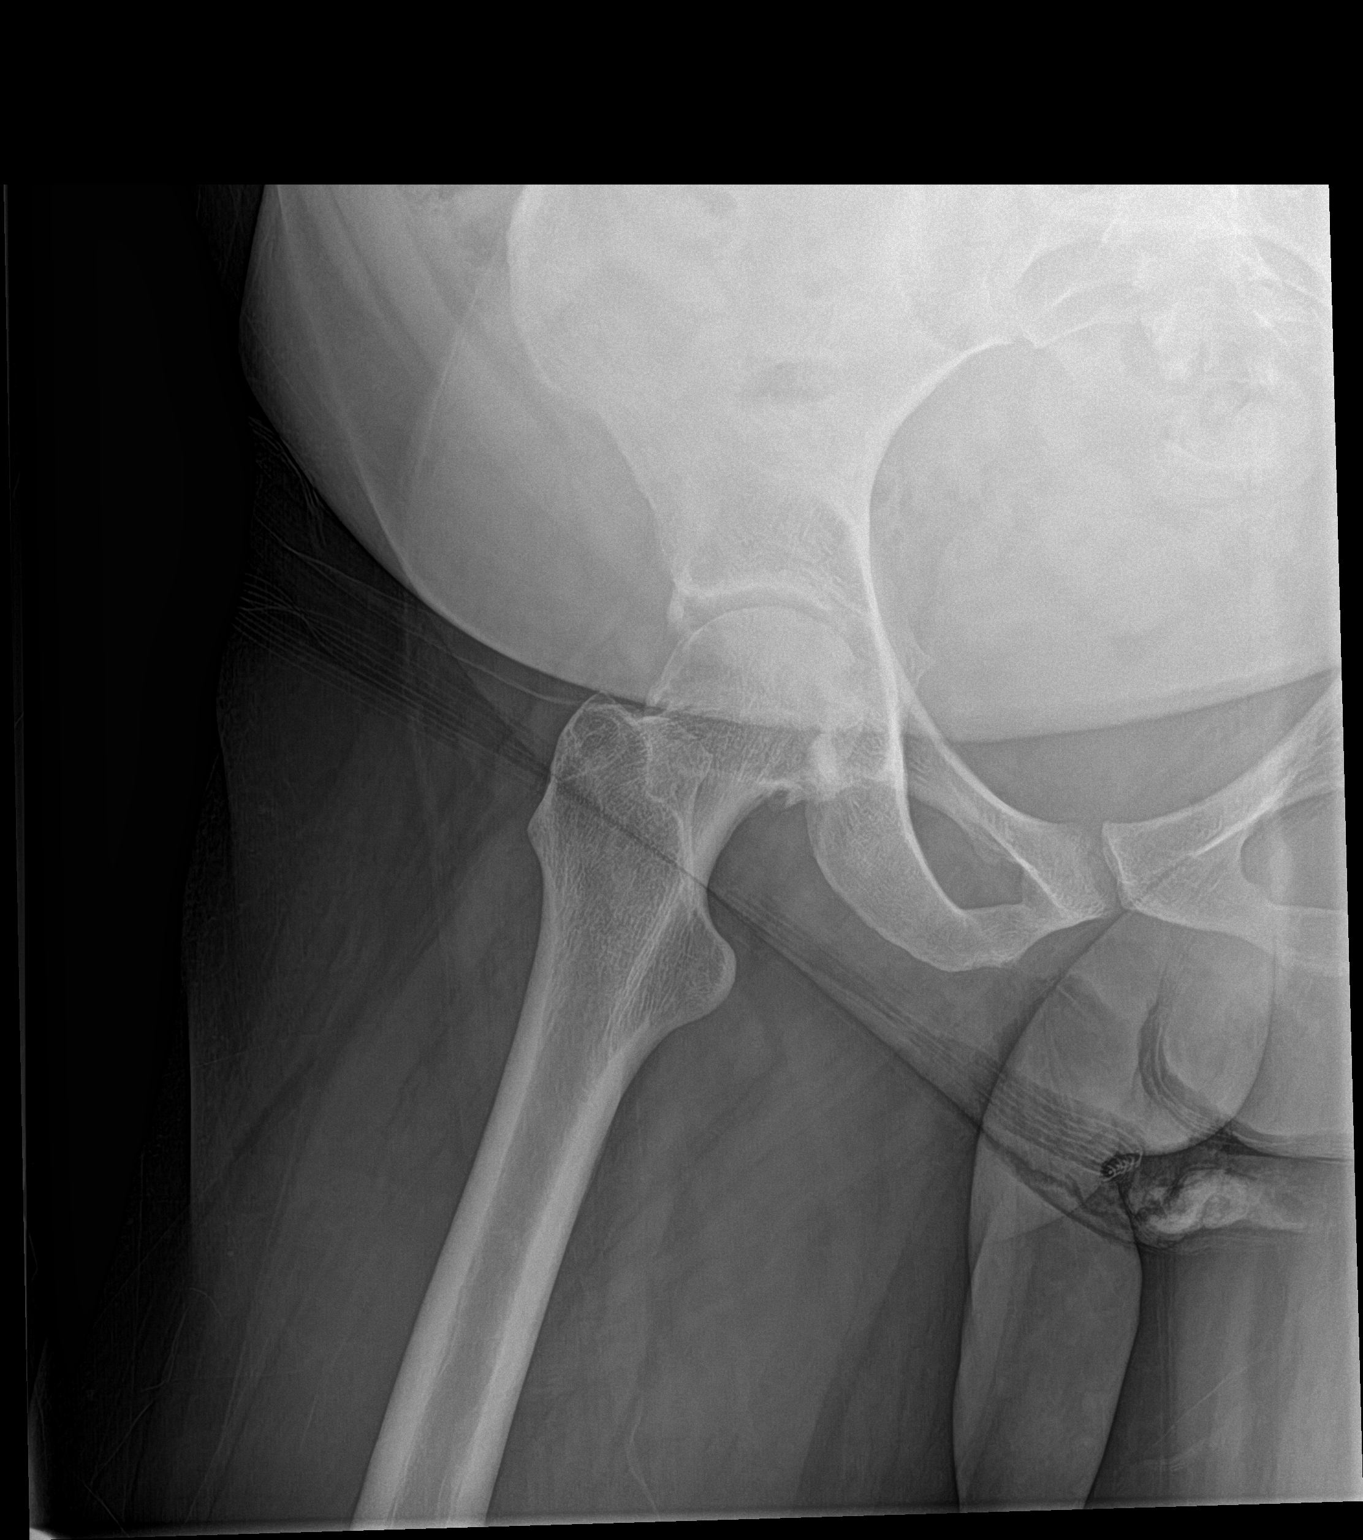

[3 of 3 positions shown; findings below may reference images not displayed]

FINDINGS: Pelvic bony ring and pubic rami are intact. Normal appearance of the
sacroiliac joints. Symmetric appearance of the hips. The right hip
is located without acute fracture. Spurring and degenerative changes
along the superior lateral aspect of the right acetabulum.
IMPRESSION: No acute abnormality to the pelvis or right hip.

## 2017-08-01 ENCOUNTER — Emergency Department (HOSPITAL_BASED_OUTPATIENT_CLINIC_OR_DEPARTMENT_OTHER)
Admission: EM | Admit: 2017-08-01 | Discharge: 2017-08-01 | Disposition: A | Discharge status: Home / Self Care | Payer: Medicaid Other | Attending: Emergency Medicine | Admitting: Emergency Medicine

## 2017-08-01 ENCOUNTER — Encounter (HOSPITAL_BASED_OUTPATIENT_CLINIC_OR_DEPARTMENT_OTHER): Payer: Self-pay | Admitting: Emergency Medicine

## 2017-08-01 ENCOUNTER — Other Ambulatory Visit: Payer: Self-pay

## 2017-08-01 DIAGNOSIS — E119 Type 2 diabetes mellitus without complications: Secondary | ICD-10-CM | POA: Diagnosis not present

## 2017-08-01 DIAGNOSIS — F329 Major depressive disorder, single episode, unspecified: Secondary | ICD-10-CM | POA: Diagnosis not present

## 2017-08-01 DIAGNOSIS — M25552 Pain in left hip: Secondary | ICD-10-CM | POA: Insufficient documentation

## 2017-08-01 DIAGNOSIS — Z79899 Other long term (current) drug therapy: Secondary | ICD-10-CM | POA: Insufficient documentation

## 2017-08-01 DIAGNOSIS — I10 Essential (primary) hypertension: Secondary | ICD-10-CM | POA: Insufficient documentation

## 2017-08-01 DIAGNOSIS — F17210 Nicotine dependence, cigarettes, uncomplicated: Secondary | ICD-10-CM | POA: Diagnosis not present

## 2017-08-01 DIAGNOSIS — Z7984 Long term (current) use of oral hypoglycemic drugs: Secondary | ICD-10-CM | POA: Diagnosis not present

## 2017-08-01 DIAGNOSIS — M25559 Pain in unspecified hip: Secondary | ICD-10-CM | POA: Diagnosis present

## 2017-08-01 DIAGNOSIS — F1410 Cocaine abuse, uncomplicated: Secondary | ICD-10-CM | POA: Insufficient documentation

## 2017-08-01 DIAGNOSIS — J449 Chronic obstructive pulmonary disease, unspecified: Secondary | ICD-10-CM | POA: Insufficient documentation

## 2017-08-01 MED ORDER — HYDROCODONE-ACETAMINOPHEN 5-325 MG PO TABS: 1.0000 | ORAL_TABLET | Freq: Four times a day (QID) | ORAL | 0 refills | Status: DC | PRN

## 2017-08-01 MED ORDER — PREDNISONE 20 MG PO TABS: ORAL_TABLET | ORAL | 0 refills | Status: DC

## 2017-08-01 MED ORDER — HYDROCODONE-ACETAMINOPHEN 5-325 MG PO TABS
1.0000 | ORAL_TABLET | Freq: Once | ORAL | Status: DC
Start: 2017-08-01 — End: 2017-08-01
  Filled 2017-08-01: qty 1

## 2017-08-01 MED ORDER — METHYLPREDNISOLONE SODIUM SUCC 125 MG IJ SOLR
125.0000 mg | Freq: Once | INTRAMUSCULAR | Status: AC
  Administered 2017-08-01: 125 mg via INTRAMUSCULAR

## 2017-08-01 MED ORDER — METHYLPREDNISOLONE SODIUM SUCC 125 MG IJ SOLR
125.0000 mg | Freq: Once | INTRAMUSCULAR | Status: DC
  Filled 2017-08-01: qty 2

## 2017-08-01 NOTE — ED Triage Notes (Signed)
R ear pain with a knot on it for "awhile" and L hip pain since last night.

## 2017-08-01 NOTE — ED Provider Notes (Signed)
[facility] EMERGENCY DEPARTMENT Provider Note   CSN: [id number] Arrival date & time: 08/01/17  1353     History   Chief Complaint Chief Complaint  Patient presents with  . Hip Pain  . Otalgia    HPI Judy Schmidt is a 51 y.o. female.  The history is provided by the patient and medical records.  Hip Pain  This is a recurrent problem. The current episode started more than 2 days ago. The problem occurs constantly. The problem has been gradually worsening. Pertinent negatives include no chest pain and no headaches. Nothing aggravates the symptoms. Nothing relieves the symptoms.  Otalgia  Pertinent negatives include no headaches.    Past Medical History:  Diagnosis Date  . Bulging lumbar disc   . COPD (chronic obstructive pulmonary disease) (HCC)   . Depression   . Diabetes mellitus without complication (HCC)   . Gall stones   . High cholesterol   . Hyperlipemia   . Hypertension   . Sciatica   . Thyroid disease     Patient Active Problem List   Diagnosis Date Noted  . Cocaine abuse with cocaine-induced mood disorder (HCC) 02/18/2017  . MDD (major depressive disorder) 02/17/2017    Past Surgical History:  Procedure Laterality Date  . HERNIA REPAIR    . KNEE SURGERY    . TUBAL LIGATION      OB History    No data available       Home Medications    Prior to Admission medications   Medication Sig Start Date End Date Taking? Authorizing Provider  amLODipine (NORVASC) 10 MG tablet Take 1 tablet (10 mg total) by mouth daily. For high blood pressure 02/19/17   Nwoko, Nicole KindredAgnes I, NP  baclofen (LIORESAL) 10 MG tablet Take 1 tablet (10 mg total) by mouth 3 (three) times daily. For muscle spasms 02/19/17   Armandina StammerNwoko, Agnes I, NP  DULoxetine 40 MG CPEP Take 40 mg by mouth daily. For depression 02/20/17   Armandina StammerNwoko, Agnes I, NP  esomeprazole (NEXIUM) 40 MG capsule Take 1 capsule (40 mg total) by mouth daily at 12 noon. For acid reflux 02/19/17   Armandina StammerNwoko, Agnes I, NP    fluticasone-salmeterol (ADVAIR HFA) 115-21 MCG/ACT inhaler Inhale 2 puffs into the lungs 2 (two) times daily. For shortness of breath 02/19/17   Armandina StammerNwoko, Agnes I, NP  HYDROcodone-acetaminophen (NORCO/VICODIN) 5-325 MG tablet Take 1 tablet every 6 (six) hours as needed by mouth for severe pain. 08/01/17   Rami Budhu, Barbara CowerJason, MD  hydrOXYzine (ATARAX/VISTARIL) 25 MG tablet Take 1 tablet (25 mg total) by mouth 3 (three) times daily as needed for anxiety. 02/19/17   Armandina StammerNwoko, Agnes I, NP  lidocaine (LIDODERM) 5 % Place 1 patch onto the skin daily. Remove & Discard patch within 12 hours or as directed by MD: Pain management 02/19/17   Armandina StammerNwoko, Agnes I, NP  memantine (NAMENDA) 5 MG tablet Take 1 tablet (5 mg total) by mouth 2 (two) times daily. For memory problems 02/19/17   Armandina StammerNwoko, Agnes I, NP  metFORMIN (GLUCOPHAGE) 500 MG tablet Take 1 tablet (500 mg total) by mouth 2 (two) times daily with a meal. For diabetes 02/19/17   Armandina StammerNwoko, Agnes I, NP  metoprolol tartrate (LOPRESSOR) 50 MG tablet Take 1 tablet (50 mg total) by mouth 2 (two) times daily. For high blood pressure 02/19/17   Nwoko, Nicole KindredAgnes I, NP  nicotine (NICODERM CQ - DOSED IN MG/24 HOURS) 21 mg/24hr patch Place 1 patch (21 mg total) onto the  skin daily. For smoking cessation 02/20/17   Armandina StammerNwoko, Agnes I, NP  pravastatin (PRAVACHOL) 10 MG tablet Take 1 tablet (10 mg total) by mouth daily. For high cholesterol 02/19/17   Nwoko, Nicole KindredAgnes I, NP  predniSONE (DELTASONE) 20 MG tablet 3 tabs po daily x 3 days, then 2 tabs x 3 days, then 1.5 tabs x 3 days, then 1 tab x 3 days, then 0.5 tabs x 3 days 08/01/17   Ronnel Zuercher, Barbara CowerJason, MD  traZODone (DESYREL) 50 MG tablet Take 1 tablet (50 mg total) by mouth at bedtime as needed for sleep. 02/19/17   Sanjuana KavaNwoko, Agnes I, NP    Family History No family history on file.  Social History Social History   Tobacco Use  . Smoking status: Current Every Day Smoker    Packs/day: 0.50    Types: Cigarettes  . Smokeless tobacco: Never Used  Substance Use  Topics  . Alcohol use: Yes    Alcohol/week: 0.5 oz    Types: 1 Standard drinks or equivalent per week    Comment: RARE  . Drug use: No     Allergies   Asa [aspirin]; Bactrim [sulfamethoxazole-trimethoprim]; Flagyl [metronidazole]; Nsaids; Plavix [clopidogrel bisulfate]; Sulfa antibiotics; and Sulfamethoxazole   Review of Systems Review of Systems  HENT: Positive for ear pain.   Cardiovascular: Negative for chest pain.  Neurological: Negative for headaches.  All other systems reviewed and are negative.    Physical Exam Updated Vital Signs BP 116/72 (BP Location: Left Arm)   Pulse 79   Temp 97.8 F (36.6 C) (Core (Comment))   Resp 18   Ht 5\' 2"  (1.575 m)   Wt 59 kg (130 lb)   SpO2 100%   BMI 23.78 kg/m   Physical Exam  Constitutional: She is oriented to person, place, and time. She appears well-developed and well-nourished.  HENT:  Head: Normocephalic and atraumatic.  Right auricular swelling without erythema, warmth or ttp  Eyes: Conjunctivae and EOM are normal.  Neck: Normal range of motion.  Cardiovascular: Normal rate and regular rhythm.  Pulmonary/Chest: Effort normal and breath sounds normal. No stridor. No respiratory distress.  Abdominal: Soft. She exhibits no distension.  Musculoskeletal: Normal range of motion. She exhibits no edema or deformity.  Neurological: She is alert and oriented to person, place, and time. No cranial nerve deficit. Coordination normal.  Skin: Skin is warm and dry.  Nursing note and vitals reviewed.    ED Treatments / Results  Labs (all labs ordered are listed, but only abnormal results are displayed) Labs Reviewed - No data to display  EKG  EKG Interpretation None       Radiology No results found.  Procedures Procedures (including critical care time)  Medications Ordered in ED Medications  HYDROcodone-acetaminophen (NORCO/VICODIN) 5-325 MG per tablet 1 tablet (1 tablet Oral Not Given 08/01/17 1528)    methylPREDNISolone sodium succinate (SOLU-MEDROL) 125 mg/2 mL injection 125 mg (125 mg Intramuscular Given 08/01/17 1524)     Initial Impression / Assessment and Plan / ED Course  I have reviewed the triage vital signs and the nursing notes.  Pertinent labs & imaging results that were available during my care of the patient were reviewed by me and considered in my medical decision making (see chart for details).     Likely sciatica with no red flag symptoms, will treat with pred taper.   Had serous fluid in swelling of right ear, aspirated with a needle. Tried applying a bolster to ear with difficulty. Will refer  to PCP/ENT for follow up.  Final Clinical Impressions(s) / ED Diagnoses   Final diagnoses:  Pain of left hip joint    ED Discharge Orders        Ordered    predniSONE (DELTASONE) 20 MG tablet     08/01/17 1511    HYDROcodone-acetaminophen (NORCO/VICODIN) 5-325 MG tablet  Every 6 hours PRN     08/01/17 1511       Jodee Wagenaar, Barbara Cower, MD 08/01/17 1608

## 2017-08-01 NOTE — ED Notes (Signed)
Pt given d/c instructions as per chart. Rx x 2 with precautions. Verbalizes understanding. No questions. 

## 2018-06-21 ENCOUNTER — Other Ambulatory Visit: Payer: Self-pay

## 2018-06-21 ENCOUNTER — Encounter (HOSPITAL_BASED_OUTPATIENT_CLINIC_OR_DEPARTMENT_OTHER): Payer: Self-pay | Admitting: *Deleted

## 2018-06-21 ENCOUNTER — Emergency Department (HOSPITAL_BASED_OUTPATIENT_CLINIC_OR_DEPARTMENT_OTHER)
Admission: EM | Admit: 2018-06-21 | Discharge: 2018-06-21 | Disposition: A | Discharge status: Home / Self Care | Payer: Medicaid Other | Attending: Emergency Medicine | Admitting: Emergency Medicine

## 2018-06-21 DIAGNOSIS — Z79899 Other long term (current) drug therapy: Secondary | ICD-10-CM | POA: Diagnosis not present

## 2018-06-21 DIAGNOSIS — J449 Chronic obstructive pulmonary disease, unspecified: Secondary | ICD-10-CM | POA: Insufficient documentation

## 2018-06-21 DIAGNOSIS — E119 Type 2 diabetes mellitus without complications: Secondary | ICD-10-CM | POA: Diagnosis not present

## 2018-06-21 DIAGNOSIS — F17210 Nicotine dependence, cigarettes, uncomplicated: Secondary | ICD-10-CM | POA: Insufficient documentation

## 2018-06-21 DIAGNOSIS — R112 Nausea with vomiting, unspecified: Secondary | ICD-10-CM | POA: Diagnosis present

## 2018-06-21 DIAGNOSIS — I10 Essential (primary) hypertension: Secondary | ICD-10-CM | POA: Diagnosis not present

## 2018-06-21 DIAGNOSIS — Z7984 Long term (current) use of oral hypoglycemic drugs: Secondary | ICD-10-CM | POA: Insufficient documentation

## 2018-06-21 HISTORY — DX: Cocaine abuse, uncomplicated: F14.10

## 2018-06-21 LAB — COMPREHENSIVE METABOLIC PANEL
ALT: 11 U/L (ref 0–44)
AST: 20 U/L (ref 15–41)
Albumin: 4.4 g/dL (ref 3.5–5.0)
Alkaline Phosphatase: 87 U/L (ref 38–126)
Anion gap: 10 (ref 5–15)
BUN: 11 mg/dL (ref 6–20)
CO2: 30 mmol/L (ref 22–32)
Calcium: 10.1 mg/dL (ref 8.9–10.3)
Chloride: 99 mmol/L (ref 98–111)
Creatinine, Ser: 0.82 mg/dL (ref 0.44–1.00)
GFR calc Af Amer: >60 mL/min (ref >60)
GFR calc non Af Amer: >60 mL/min (ref >60)
Glucose, Bld: 91 mg/dL (ref 70–99)
Potassium: 3.5 mmol/L (ref 3.5–5.1)
Sodium: 139 mmol/L (ref 135–145)
Total Bilirubin: 0.3 mg/dL (ref 0.3–1.2)
Total Protein: 8 g/dL (ref 6.5–8.1)

## 2018-06-21 LAB — CBC WITH DIFFERENTIAL/PLATELET
Basophils Absolute: 0 10*3/uL (ref 0.0–0.1)
Basophils Relative: 0 %
Eosinophils Absolute: 0.1 10*3/uL (ref 0.0–0.7)
Eosinophils Relative: 1 %
HCT: 44.2 % (ref 36.0–46.0)
Hemoglobin: 14.7 g/dL (ref 12.0–15.0)
Lymphocytes Relative: 53 %
Lymphs Abs: 4.9 10*3/uL — ABNORMAL HIGH (ref 0.7–4.0)
MCH: 28 pg (ref 26.0–34.0)
MCHC: 33.3 g/dL (ref 30.0–36.0)
MCV: 84.2 fL (ref 78.0–100.0)
Monocytes Absolute: 0.6 10*3/uL (ref 0.1–1.0)
Monocytes Relative: 7 %
Neutro Abs: 3.6 10*3/uL (ref 1.7–7.7)
Neutrophils Relative %: 39 %
Platelets: 323 10*3/uL (ref 150–400)
RBC: 5.25 MIL/uL — ABNORMAL HIGH (ref 3.87–5.11)
RDW: 14.7 % (ref 11.5–15.5)
WBC: 9.2 10*3/uL (ref 4.0–10.5)

## 2018-06-21 LAB — LIPASE, BLOOD: Lipase: 45 U/L (ref 11–51)

## 2018-06-21 LAB — URINALYSIS, ROUTINE W REFLEX MICROSCOPIC
Bilirubin Urine: NEGATIVE
Glucose, UA: NEGATIVE mg/dL
Hgb urine dipstick: NEGATIVE
Ketones, ur: NEGATIVE mg/dL
Leukocytes, UA: NEGATIVE
Nitrite: NEGATIVE
Protein, ur: NEGATIVE mg/dL
Specific Gravity, Urine: <1.005 — ABNORMAL LOW (ref 1.005–1.030)
pH: 7 (ref 5.0–8.0)

## 2018-06-21 MED ORDER — MORPHINE SULFATE (PF) 4 MG/ML IV SOLN
4.0000 mg | Freq: Once | INTRAVENOUS | Status: AC
  Administered 2018-06-21: 4 mg via INTRAVENOUS
  Filled 2018-06-21: qty 1

## 2018-06-21 MED ORDER — GI COCKTAIL ~~LOC~~
30.0000 mL | Freq: Once | ORAL | Status: AC
  Administered 2018-06-21: 30 mL via ORAL
  Filled 2018-06-21: qty 30

## 2018-06-21 MED ORDER — LACTATED RINGERS IV BOLUS
1000.0000 mL | Freq: Once | INTRAVENOUS | Status: AC
  Administered 2018-06-21: 1000 mL via INTRAVENOUS

## 2018-06-21 MED ORDER — DIPHENHYDRAMINE HCL 50 MG/ML IJ SOLN
25.0000 mg | Freq: Once | INTRAMUSCULAR | Status: AC
  Administered 2018-06-21: 25 mg via INTRAVENOUS
  Filled 2018-06-21: qty 1

## 2018-06-21 MED ORDER — FAMOTIDINE IN NACL 20-0.9 MG/50ML-% IV SOLN
20.0000 mg | Freq: Once | INTRAVENOUS | Status: AC
  Administered 2018-06-21: 20 mg via INTRAVENOUS
  Filled 2018-06-21: qty 50

## 2018-06-21 MED ORDER — FAMOTIDINE 20 MG PO TABS: 20.0000 mg | ORAL_TABLET | Freq: Two times a day (BID) | ORAL | 0 refills | Status: AC

## 2018-06-21 MED ORDER — ONDANSETRON HCL 4 MG/2ML IJ SOLN
4.0000 mg | Freq: Once | INTRAMUSCULAR | Status: AC
  Administered 2018-06-21: 4 mg via INTRAVENOUS
  Filled 2018-06-21: qty 2

## 2018-06-21 NOTE — ED Triage Notes (Signed)
Pt c/o n/v x 5 days ago  Pt of [facility]

## 2018-06-21 NOTE — ED Notes (Signed)
Pt eating some crackers on the room no sign of nausea or vomiting at this time.

## 2018-06-21 NOTE — ED Provider Notes (Signed)
[facility] EMERGENCY DEPARTMENT Provider Note   CSN: [id number] Arrival date & time: 06/21/18  1826     History   Chief Complaint Chief Complaint  Patient presents with  . Vomiting    HPI Judy Schmidt is a 52 y.o. female.  HPI  52 year old female with nausea and vomiting.  Symptom onset about 5 days ago.  Intermittent since then.  Associated with some pain in upper abdomen.  Does not lateralize.  Symptoms wax and wane without any appreciable exacerbating factors.  No diarrhea.  No urinary complaints.  No sick contacts.  Denies any Szymon alcohol use.  She is coming from [facility] where she is there for cocaine abuse.  Past Medical History:  Diagnosis Date  . Bulging lumbar disc   . Cocaine abuse (HCC)   . COPD (chronic obstructive pulmonary disease) (HCC)   . Depression   . Diabetes mellitus without complication (HCC)   . Gall stones   . High cholesterol   . Hyperlipemia   . Hypertension   . Sciatica   . Thyroid disease     Patient Active Problem List   Diagnosis Date Noted  . Cocaine abuse with cocaine-induced mood disorder (HCC) 02/18/2017  . MDD (major depressive disorder) 02/17/2017    Past Surgical History:  Procedure Laterality Date  . HERNIA REPAIR    . KNEE SURGERY    . TUBAL LIGATION       OB History   None      Home Medications    Prior to Admission medications   Medication Sig Start Date End Date Taking? Authorizing Provider  amLODipine (NORVASC) 10 MG tablet Take 1 tablet (10 mg total) by mouth daily. For high blood pressure 02/19/17   Nwoko, Nicole Kindred I, NP  metFORMIN (GLUCOPHAGE) 500 MG tablet Take 1 tablet (500 mg total) by mouth 2 (two) times daily with a meal. For diabetes 02/19/17   Armandina Stammer I, NP  metoprolol tartrate (LOPRESSOR) 50 MG tablet Take 1 tablet (50 mg total) by mouth 2 (two) times daily. For high blood pressure 02/19/17   Nwoko, Nicole Kindred I, NP  pravastatin (PRAVACHOL) 10 MG tablet Take 1 tablet (10 mg total) by mouth  daily. For high cholesterol 02/19/17   Sanjuana Kava, NP    Family History History reviewed. No pertinent family history.  Social History Social History   Tobacco Use  . Smoking status: Current Every Day Smoker    Packs/day: 0.50    Types: Cigarettes  . Smokeless tobacco: Never Used  Substance Use Topics  . Alcohol use: Yes    Alcohol/week: 1.0 standard drinks    Types: 1 Standard drinks or equivalent per week    Comment: RARE  . Drug use: No     Allergies   Asa [aspirin]; Bactrim [sulfamethoxazole-trimethoprim]; Flagyl [metronidazole]; Nsaids; Plavix [clopidogrel bisulfate]; Sulfa antibiotics; and Sulfamethoxazole   Review of Systems Review of Systems  All systems reviewed and negative, other than as noted in HPI.  Physical Exam Updated Vital Signs BP 140/85   Pulse 60   Temp 98.3 F (36.8 C)   Resp 18   Ht 5\' 3"  (1.6 m)   Wt 51.3 kg   SpO2 100%   BMI 20.02 kg/m   Physical Exam  Constitutional: She appears well-developed and well-nourished. No distress.  HENT:  Head: Normocephalic and atraumatic.  Eyes: Conjunctivae are normal. Right eye exhibits no discharge. Left eye exhibits no discharge.  Neck: Neck supple.  Cardiovascular: Normal rate, regular  rhythm and normal heart sounds. Exam reveals no gallop and no friction rub.  No murmur heard. Pulmonary/Chest: Effort normal and breath sounds normal. No respiratory distress.  Abdominal: Soft. She exhibits no distension. There is tenderness.  Mild epigastric tenderness w/o rebound or guarding  Musculoskeletal: She exhibits no edema or tenderness.  Neurological: She is alert.  Skin: Skin is warm and dry.  Psychiatric: She has a normal mood and affect. Her behavior is normal. Thought content normal.  Nursing note and vitals reviewed.    ED Treatments / Results  Labs (all labs ordered are listed, but only abnormal results are displayed) Labs Reviewed  URINALYSIS, ROUTINE W REFLEX MICROSCOPIC - Abnormal;  Notable for the following components:      Result Value   Specific Gravity, Urine <1.005 (*)    All other components within normal limits  CBC WITH DIFFERENTIAL/PLATELET - Abnormal; Notable for the following components:   RBC 5.25 (*)    Lymphs Abs 4.9 (*)    All other components within normal limits  LIPASE, BLOOD  COMPREHENSIVE METABOLIC PANEL    EKG None  Radiology No results found.  Procedures Procedures (including critical care time)  Medications Ordered in ED Medications  famotidine (PEPCID) IVPB 20 mg premix (20 mg Intravenous New Bag/Given 06/21/18 2242)  lactated ringers bolus 1,000 mL ( Intravenous Stopped 06/21/18 2230)  ondansetron (ZOFRAN) injection 4 mg (4 mg Intravenous Given 06/21/18 2107)  morphine 4 MG/ML injection 4 mg (4 mg Intravenous Given 06/21/18 2107)  diphenhydrAMINE (BENADRYL) injection 25 mg (25 mg Intravenous Given 06/21/18 2205)  gi cocktail (Maalox,Lidocaine,Donnatal) (30 mLs Oral Given 06/21/18 2241)     Initial Impression / Assessment and Plan / ED Course  I have reviewed the triage vital signs and the nursing notes.  Pertinent labs & imaging results that were available during my care of the patient were reviewed by me and considered in my medical decision making (see chart for details).     51yF with n/v. Viral GI illness? Withdrawal? Seems like there may be some component of anxiety. Nontoxic. Symptoms improved. Tolerating PO. '  It has been determined that no acute conditions requiring further emergency intervention are present at this time. The patient has been advised of the diagnosis and plan. I reviewed any labs and imaging including any potential incidental findings. We have discussed signs and symptoms that warrant return to the ED and they are listed in the discharge instructions.    Final Clinical Impressions(s) / ED Diagnoses   Final diagnoses:  Nausea and vomiting, intractability of vomiting not specified, unspecified vomiting  type    ED Discharge Orders    None       Raeford Razor, MD 06/23/18 1510

## 2020-02-16 ENCOUNTER — Emergency Department (HOSPITAL_BASED_OUTPATIENT_CLINIC_OR_DEPARTMENT_OTHER)
Admission: EM | Admit: 2020-02-16 | Discharge: 2020-02-16 | Disposition: A | Discharge status: Home / Self Care | Payer: Medicaid Other | Attending: Emergency Medicine | Admitting: Emergency Medicine

## 2020-02-16 DIAGNOSIS — R21 Rash and other nonspecific skin eruption: Secondary | ICD-10-CM

## 2020-02-16 MED ORDER — CLOTRIMAZOLE 1 % EX CREA
TOPICAL_CREAM | CUTANEOUS | 0 refills | Status: DC
Start: 2020-02-16 — End: 2021-05-28

## 2020-02-16 MED ORDER — HYDROXYZINE HCL 25 MG PO TABS: 25.0000 mg | ORAL_TABLET | Freq: Three times a day (TID) | ORAL | 0 refills | Status: AC | PRN

## 2020-02-16 MED FILL — ANTIFUNGAL CLOTRIMAZOLE 1 %: 1 | 14 days supply | Qty: 28 | Fill #0

## 2020-02-16 MED FILL — hydrOXYzine HCL 25 MG TABS: 25 | 10 days supply | Qty: 30 | Fill #0

## 2020-02-16 NOTE — ED Provider Notes (Signed)
[facility] EMERGENCY DEPARTMENT Provider Note   CSN: [id number] Arrival date & time: 02/16/20  1237     History Chief Complaint  Patient presents with  . Rash    Judy Schmidt is a 54 y.o. female.  The history is provided by the patient.  Rash Location: under both breasts in the skin fold. Quality: itchiness, painful and redness   Pain details:    Severity:  Mild   Onset quality:  Gradual   Duration:  2 weeks   Timing:  Constant   Progression:  Unchanged Severity:  Mild Onset quality:  Gradual Timing:  Constant Progression:  Unchanged Chronicity:  New Context comment:  Fungal rash under the breast bilaterally, tried nystatin. Relieved by:  Anti-fungal cream Worsened by:  Nothing Associated symptoms: no diarrhea and no joint pain        No past medical history on file.  There are no problems to display for this patient.     OB History   No obstetric history on file.     No family history on file.  Social History   Tobacco Use  . Smoking status: Not on file  Substance Use Topics  . Alcohol use: Not on file  . Drug use: Not on file    Home Medications Prior to Admission medications   Medication Sig Start Date End Date Taking? Authorizing Provider  clotrimazole (LOTRIMIN) 1 % cream Apply to affected area 2 times daily until cleared 02/16/20   Lennice Sites, DO  hydrOXYzine (ATARAX/VISTARIL) 25 MG tablet Take 1 tablet (25 mg total) by mouth every 8 (eight) hours as needed for up to 30 doses. 02/16/20   Lennice Sites, DO    Allergies    Patient has no allergy information on record.  Review of Systems   Review of Systems  Gastrointestinal: Negative for diarrhea.  Musculoskeletal: Negative for arthralgias.  Skin: Positive for rash.    Physical Exam Updated Vital Signs BP (!) 117/92 (BP Location: Left Arm)   Pulse 98   Temp 98.8 F (37.1 C) (Oral)   Resp 20   Ht 5\' 2"  (1.575 m)   Wt 74.8 kg   SpO2 98%   BMI 30.18 kg/m    Physical Exam  ED Results / Procedures / Treatments   Labs (all labs ordered are listed, but only abnormal results are displayed) Labs Reviewed - No data to display  EKG None  Radiology No results found.  Procedures Procedures (including critical care time)  Medications Ordered in ED Medications - No data to display  ED Course  I have reviewed the triage vital signs and the nursing notes.  Pertinent labs & imaging results that were available during my care of the patient were reviewed by me and considered in my medical decision making (see chart for details).    MDM Rules/Calculators/A&P                      Judy Schmidt is a 54 year old female with no significant medical history presents the ED with rash.  Patient with normal vitals.  No fever.  Patient appears to have fungal type rash on there both breasts within the skin fold.  She has tried nystatin with minimal relief.  She is mostly complaining of itching.  Educated her about keeping the area nice and dry and will switch patient over to lamotrin.  She likely may need referral to dermatology for more special mixture as this appears to be fairly  persistent.  Will prescribe Vistaril for itchiness.  Discharged in good condition.  Given return precautions.  This chart was dictated using voice recognition software.  Despite best efforts to proofread,  errors can occur which can change the documentation meaning.    Final Clinical Impression(s) / ED Diagnoses Final diagnoses:  Rash    Rx / DC Orders ED Discharge Orders         Ordered    clotrimazole (LOTRIMIN) 1 % cream     02/16/20 1448    hydrOXYzine (ATARAX/VISTARIL) 25 MG tablet  Every 8 hours PRN     02/16/20 1448           Virgina Norfolk, DO 02/16/20 1453

## 2020-02-16 NOTE — ED Notes (Signed)
Pt arrived during computer downtime. See downtime documentation for info up to this point.

## 2020-02-16 NOTE — ED Notes (Signed)
ED Provider at bedside. 

## 2020-02-16 NOTE — Discharge Instructions (Addendum)
Follow-up with your primary care doctor.  You may need referral to dermatology for more specialized cream.  Please keep this area as clean and dry as possible.  Put on this cream twice a day but the moment that you have increased moisture please dry area.  When you put on this cream every day make sure that you have washed and dried affected area as much as possible.

## 2020-10-03 ENCOUNTER — Emergency Department (HOSPITAL_COMMUNITY)
Admission: EM | Admit: 2020-10-03 | Discharge: 2020-10-03 | Disposition: A | Discharge status: Home / Self Care | Payer: Medicaid Other | Attending: Emergency Medicine | Admitting: Emergency Medicine

## 2020-10-03 ENCOUNTER — Emergency Department (HOSPITAL_COMMUNITY): Payer: Medicaid Other

## 2020-10-03 DIAGNOSIS — E1159 Type 2 diabetes mellitus with other circulatory complications: Secondary | ICD-10-CM | POA: Diagnosis not present

## 2020-10-03 DIAGNOSIS — J449 Chronic obstructive pulmonary disease, unspecified: Secondary | ICD-10-CM | POA: Insufficient documentation

## 2020-10-03 DIAGNOSIS — R259 Unspecified abnormal involuntary movements: Secondary | ICD-10-CM | POA: Diagnosis not present

## 2020-10-03 DIAGNOSIS — R2990 Unspecified symptoms and signs involving the nervous system: Secondary | ICD-10-CM

## 2020-10-03 DIAGNOSIS — I10 Essential (primary) hypertension: Secondary | ICD-10-CM | POA: Insufficient documentation

## 2020-10-03 DIAGNOSIS — F17210 Nicotine dependence, cigarettes, uncomplicated: Secondary | ICD-10-CM | POA: Insufficient documentation

## 2020-10-03 DIAGNOSIS — Z79899 Other long term (current) drug therapy: Secondary | ICD-10-CM | POA: Diagnosis not present

## 2020-10-03 DIAGNOSIS — I639 Cerebral infarction, unspecified: Secondary | ICD-10-CM | POA: Diagnosis present

## 2020-10-03 DIAGNOSIS — Z7984 Long term (current) use of oral hypoglycemic drugs: Secondary | ICD-10-CM | POA: Insufficient documentation

## 2020-10-03 DIAGNOSIS — R569 Unspecified convulsions: Secondary | ICD-10-CM | POA: Diagnosis not present

## 2020-10-03 DIAGNOSIS — R079 Chest pain, unspecified: Secondary | ICD-10-CM

## 2020-10-03 LAB — I-STAT CHEM 8, ED
BUN: 4 mg/dL — ABNORMAL LOW (ref 6–20)
Calcium, Ion: 1.18 mmol/L (ref 1.15–1.40)
Chloride: 105 mmol/L (ref 98–111)
Creatinine, Ser: 0.6 mg/dL (ref 0.44–1.00)
Glucose, Bld: 103 mg/dL — ABNORMAL HIGH (ref 70–99)
HCT: 40 % (ref 36.0–46.0)
Hemoglobin: 13.6 g/dL (ref 12.0–15.0)
Potassium: 3 mmol/L — ABNORMAL LOW (ref 3.5–5.1)
Sodium: 142 mmol/L (ref 135–145)
TCO2: 24 mmol/L (ref 22–32)

## 2020-10-03 LAB — COMPREHENSIVE METABOLIC PANEL
ALT: 13 U/L (ref 0–44)
AST: 18 U/L (ref 15–41)
Albumin: 3.8 g/dL (ref 3.5–5.0)
Alkaline Phosphatase: 75 U/L (ref 38–126)
Anion gap: 10 (ref 5–15)
BUN: 5 mg/dL — ABNORMAL LOW (ref 6–20)
CO2: 23 mmol/L (ref 22–32)
Calcium: 9.4 mg/dL (ref 8.9–10.3)
Chloride: 106 mmol/L (ref 98–111)
Creatinine, Ser: 0.74 mg/dL (ref 0.44–1.00)
GFR, Estimated: >60 mL/min (ref >60)
Glucose, Bld: 108 mg/dL — ABNORMAL HIGH (ref 70–99)
Potassium: 3 mmol/L — ABNORMAL LOW (ref 3.5–5.1)
Sodium: 139 mmol/L (ref 135–145)
Total Bilirubin: 0.6 mg/dL (ref 0.3–1.2)
Total Protein: 6.8 g/dL (ref 6.5–8.1)

## 2020-10-03 LAB — CBC
HCT: 40.2 % (ref 36.0–46.0)
Hemoglobin: 12.8 g/dL (ref 12.0–15.0)
MCH: 27.1 pg (ref 26.0–34.0)
MCHC: 31.8 g/dL (ref 30.0–36.0)
MCV: 85 fL (ref 80.0–100.0)
Platelets: 234 10*3/uL (ref 150–400)
RBC: 4.73 MIL/uL (ref 3.87–5.11)
RDW: 15.3 % (ref 11.5–15.5)
WBC: 8.5 10*3/uL (ref 4.0–10.5)
nRBC: 0 % (ref 0.0–0.2)

## 2020-10-03 LAB — DIFFERENTIAL
Abs Immature Granulocytes: 0.02 10*3/uL (ref 0.00–0.07)
Basophils Absolute: 0 10*3/uL (ref 0.0–0.1)
Basophils Relative: 1 %
Eosinophils Absolute: 0.1 10*3/uL (ref 0.0–0.5)
Eosinophils Relative: 1 %
Immature Granulocytes: 0 %
Lymphocytes Relative: 34 %
Lymphs Abs: 2.9 10*3/uL (ref 0.7–4.0)
Monocytes Absolute: 0.6 10*3/uL (ref 0.1–1.0)
Monocytes Relative: 7 %
Neutro Abs: 4.9 10*3/uL (ref 1.7–7.7)
Neutrophils Relative %: 57 %

## 2020-10-03 LAB — I-STAT BETA HCG BLOOD, ED (MC, WL, AP ONLY): I-stat hCG, quantitative: 5.6 m[IU]/mL — ABNORMAL HIGH (ref <5)

## 2020-10-03 LAB — PROTIME-INR
INR: 1 (ref 0.8–1.2)
Prothrombin Time: 12.3 seconds (ref 11.4–15.2)

## 2020-10-03 LAB — APTT: aPTT: 27 seconds (ref 24–36)

## 2020-10-03 LAB — CBG MONITORING, ED: Glucose-Capillary: 110 mg/dL — ABNORMAL HIGH (ref 70–99)

## 2020-10-03 LAB — TROPONIN I (HIGH SENSITIVITY): Troponin I (High Sensitivity): 3 ng/L (ref <18)

## 2020-10-03 MED ORDER — POTASSIUM CHLORIDE CRYS ER 20 MEQ PO TBCR
40.0000 meq | EXTENDED_RELEASE_TABLET | Freq: Once | ORAL | Status: AC
  Administered 2020-10-03: 40 meq via ORAL
  Filled 2020-10-03: qty 2

## 2020-10-03 MED ORDER — LORAZEPAM 2 MG/ML IJ SOLN
INTRAMUSCULAR | Status: AC
  Administered 2020-10-03: 1 mg via INTRAVENOUS
  Filled 2020-10-03: qty 1

## 2020-10-03 MED ORDER — IOHEXOL 350 MG/ML SOLN
60.0000 mL | Freq: Once | INTRAVENOUS | Status: AC | PRN
  Administered 2020-10-03: 60 mL via INTRAVENOUS

## 2020-10-03 MED ORDER — SODIUM CHLORIDE 0.9% FLUSH: 3.0000 mL | Freq: Once | INTRAVENOUS | Status: DC

## 2020-10-03 MED ORDER — LORAZEPAM 2 MG/ML IJ SOLN: 1.0000 mg | Freq: Once | INTRAMUSCULAR | Status: AC

## 2020-10-03 MED ORDER — IOHEXOL 350 MG/ML SOLN
80.0000 mL | Freq: Once | INTRAVENOUS | Status: AC | PRN
  Administered 2020-10-03: 80 mL via INTRAVENOUS

## 2020-10-03 NOTE — Consult Note (Signed)
Neurology Code Stroke Consult H&P  CC: CODE STROKE  History is obtained from: EMS at first, some from patient later  HPI: Rolla Hattabaugh is a 55 y.o. female with a PMHx of depression, HTN, GERD, DM II, HLD, emphysema, OA, lumbar DDD, and remote CVA. She has a cocaine hx but denies any recent use.  Per EMS, pt was at home today around 2pm and began to have garbled speech and called her grandson. When grandson arrived pt was on the floor with seizure like activity. 911 was called. Upon EMS arrival, pt's eyes were open and seizure like activity was observed described as BUE and BLE extremity shaking. No head shaking noted. EMS also said that pt could not distinguish between her right and left UE. EMS gave Versed 5mg  in the field. CBG 127/110 here. BP 124/80.   On immediate exam, patient was keenly responsive and noted to have dysarthria. She was able to move all extremities but stated moving the LEs caused her pain. Her exam was inconsistent.   In CT, CTH was without acute finding or bleed. After CTH, pt began to shake her arms/shoulders, then sometimes her LEs. Her breathing heavy during event. She stopped this activity on her own and yelled out "what the hell" when RN touched her left arm, stating it hurt. Ativan was given for relaxation and after Ativan, she was noted to have occasional shaking in intervals. Her head did not shake. She tended to look somewhat to the right during event, but no entirely lateral. CTA showed no LVO.    Pt was taken back to ED room and examined again. 2nd exam is one charted below. Stat EEG ordered and will do MRI brain later.   LKW: 1400 hrs tpa given?: No, no evidence of acute stroke and very inconsistent neuro exam.  IR Thrombectomy? No, no LVO.  MRS 1.   NIHSS:  1a Level of Conscious: 0  1b LOC Questions: 0 1c LOC Commands: 0 2 Best Gaze: 0 3 Visual: 0 4 Facial Palsy: 0 5a Motor Arm - left: 1 5b Motor Arm - Right: 0 6a Motor Leg - Left: 1 6b Motor Leg -  Right: 0 7 Limb Ataxia: 1 8 Sensory: 0 9 Best Language: 0 10 Dysarthria: 1 11 Extinct. and Inatten: 0 TOTAL: 4   ROS: A complete ROS was unable to be performed due to emergent event. Denies HA, N/V, or dysphagia.   Past Medical History:  Diagnosis Date  . Bulging lumbar disc   . Cocaine abuse (HCC)   . COPD (chronic obstructive pulmonary disease) (HCC)   . Depression   . Diabetes mellitus without complication (HCC)   . Gall stones   . High cholesterol   . Hyperlipemia   . Hypertension   . Sciatica   . Thyroid disease    FMHx: Father had seizures.   Social History:  reports that she has been smoking cigarettes. She has been smoking about 0.50 packs per day. She has never used smokeless tobacco. She reports current alcohol use of about 1.0 standard drink of alcohol per week. She reports that she does not use drugs. Admits to prior cocaine use, but not in a "long time".    Prior to Admission medications   Medication Sig Start Date End Date Taking? Authorizing Provider  amLODipine (NORVASC) 10 MG tablet Take 1 tablet (10 mg total) by mouth daily. For high blood pressure 02/19/17   02/21/17 I, NP  clotrimazole (LOTRIMIN) 1 % cream Apply  to affected area 2 times daily until cleared 02/16/20   Virgina Norfolk, DO  famotidine (PEPCID) 20 MG tablet Take 1 tablet (20 mg total) by mouth 2 (two) times daily. 06/21/18   Raeford Razor, MD  hydrOXYzine (ATARAX/VISTARIL) 25 MG tablet Take 1 tablet (25 mg total) by mouth every 8 (eight) hours as needed for up to 30 doses. 02/16/20   Curatolo, Adam, DO  metFORMIN (GLUCOPHAGE) 500 MG tablet Take 1 tablet (500 mg total) by mouth 2 (two) times daily with a meal. For diabetes 02/19/17   Armandina Stammer I, NP  metoprolol tartrate (LOPRESSOR) 50 MG tablet Take 1 tablet (50 mg total) by mouth 2 (two) times daily. For high blood pressure 02/19/17   Nwoko, Nicole Kindred I, NP  pravastatin (PRAVACHOL) 10 MG tablet Take 1 tablet (10 mg total) by mouth daily. For high  cholesterol 02/19/17   Armandina Stammer I, NP    Exam: Current vital signs: BP 124/80.   Physical Exam  Constitutional: Appears well-developed and well-nourished.  Psych: Affect appropriate to situation Eyes: No scleral injection HENT: No OP obstrucion Head: Normocephalic.  Cardiovascular: RRR  Respiratory: Effort normal. No evidence of lack of airway protection even during seizure like activity.  GI: Soft.  Skin: WDI  Neuro: Mental Status: Patient is awake, alert, oriented to person, month and year. Not to date. At one point, she said she was here because of a seizure and later she stated chest pain.  Patient is unable to give a clear and reliable history. No signs of aphasia or neglect. Speech/Language: Speech is intact and fluent. Mild dysarthria, but patient seems to be trying to slur her words. Comprehension, repetition, and naming intact.  Cranial Nerves: II: Visual Fields are full. Pupils are equal, round, and reactive to light. III,IV, VI: EOMI without ptosis or diploplia.  V: Facial sensation is decreased to left forehead. Able to move jaw back and forth.  VII: Smile is symmetrical. Able to puff cheeks and raise eyebrows.  VIII: hearing is intact to voice IX, X: Uvula elevates symmetrically. Phonation normal.  XI: Shoulder shrug is symmetric. XII: tongue is midline without atrophy or fasciculations.  Motor: Tone is normal. Bulk is normal. Triceps/biceps UEs 4/5, grip 5/5 on right, 4/5 on left. Dorsiflexion and plantar flexion 5/5 bilaterally.  Sensory: Sensation is inconsistent to light touch. Extinction intact at times, but not others. Sensation to light touch varies to all extremities. Vibration to forehead is + on right, - on left and stops midline.  Plantars: Toes are downgoing bilaterally. Cerebellar: FNF ataxic on the left. HKS not tested due to pt yelling out in pain. No drift to BUE. Drift on LEs difficult to assess due to her c/o pain and refusal to participate.  She is able to lift both legs however.  Gait: deferred.   I have reviewed labs in epic and the pertinent results are:  INR 1.0     Dr Wilford Corner independently reviewed images: CTH  1. Negative CT head 2. ASPECTS is 10 CTA head and neck. No emergent large vessel occlusion or other acute finding. No evidence of hemodynamically significant proximal arterial stenosis.   Assessment: Gilberto Streck is a 55 y.o. female PMHx of depression, DM II, HTN, and previous stroke. Pt presented to the ED by EMS as a CODE STROKE. She noticed garbled speech at home around 1430 hrs and called her grandson. When grandson arrived, he found pt on the floor with seizure like activity. When EMS arrived, patient continued to  have shaking of her extremities but not head without LOC. EMS also stated that pt could no distinguish between right and left arm. Versed was given in the field. Pt's neuro exam was very inconsistent and varied between examiners. Seizure like activity was observed in CT suite, but pt recovered within 2 mins and was not post ictal.   Impression:  1. Inconsistent neuro exam without noted focal deficit. 2. Seizure like activity, likely psychogenic.   Plan:  MRI brain stat EEG stat If negative-no further neurological work-up   Pt seen by Jimmye Norman, NP and MD. Note/plan to be edited by MD as necessary.  Pager: [phone]  Attending addendum Patient seen and examined as a code stroke-she has a history of depression hypertension reflux, diabetes, hypertension, hyperlipidemia, remote history of a stroke without any residual deficits, history of cocaine use, aortic thrombus-requiring follow-up per vascular surgery. Brought in for garbled speech and questionable weakness on the left side noted by the EMTs. CBG and blood pressure within normal limits on sign Her examination was very inconsistent with effort dependent weakness. There was also question of seizure activity noted-she also had  seizure-like activity in the CT scanner which looked very psychogenic nonepileptic type on my observation. Stat EEG was recommended and completed-had no seizures noted.  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 Given her risk factors I did pursue stat MRI of the brain-preliminary read negative for stroke. Await formal MRI recommendation-if negative, no further neurological recommendations. Family reports exceptional stressors and a history of panic attacks.  I would think that this is probably one of the same kind of panic attacks she has had in the past but given that there was focal neurological deficit reported, although exam was inconsistent, I did perform due diligence with getting an EEG and an MRI. She might benefit from outpatient psychiatry referral. Vascular surgery follow-up of imaging for aortic thrombus finding-might be related to the chest pain- we will defer to vascular surgery and ED provider. Discussed my plan in detail with Dr. Benita Stabile provider Please call neurology with questions.Marland Kitchen -- Milon Dikes, MD Neurologist [facility] Pager: [phone]

## 2020-10-03 NOTE — ED Provider Notes (Signed)
[facility] Provider Note   CSN: [id number] Arrival date & time: 10/03/20  [id number]  An emergency department physician performed an initial assessment on this suspected stroke patient at 1523.  History Chief Complaint  Patient presents with  . Code Stroke    Dellis AnesLillia Beldin is a 55 y.o. female.  Code stroke called, last known normal unknown, possible seizure-like activity.  Glucose normal in route.  Patient denies any substance abuse.,  Patient states she has history of stroke.        Past Medical History:  Diagnosis Date  . Bulging lumbar disc   . Cocaine abuse (HCC)   . COPD (chronic obstructive pulmonary disease) (HCC)   . Depression   . Diabetes mellitus without complication (HCC)   . Gall stones   . High cholesterol   . Hyperlipemia   . Hypertension   . Sciatica   . Thyroid disease     Patient Active Problem List   Diagnosis Date Noted  . Cocaine abuse with cocaine-induced mood disorder (HCC) 02/18/2017  . MDD (major depressive disorder) 02/17/2017    Past Surgical History:  Procedure Laterality Date  . HERNIA REPAIR    . KNEE SURGERY    . TUBAL LIGATION       OB History   No obstetric history on file.     No family history on file.  Social History   Tobacco Use  . Smoking status: Current Every Day Smoker    Packs/day: 0.50    Types: Cigarettes  . Smokeless tobacco: Never Used  Substance Use Topics  . Alcohol use: Yes    Alcohol/week: 1.0 standard drink    Types: 1 Standard drinks or equivalent per week    Comment: RARE  . Drug use: No    Home Medications Prior to Admission medications   Medication Sig Start Date End Date Taking? Authorizing Provider  amLODipine (NORVASC) 10 MG tablet Take 1 tablet (10 mg total) by mouth daily. For high blood pressure 02/19/17   Armandina StammerNwoko, Agnes I, NP  clotrimazole (LOTRIMIN) 1 % cream Apply to affected area 2 times daily until cleared 02/16/20   Curatolo, Adam, DO   famotidine (PEPCID) 20 MG tablet Take 1 tablet (20 mg total) by mouth 2 (two) times daily. 06/21/18   Raeford RazorKohut, Stephen, MD  hydrOXYzine (ATARAX/VISTARIL) 25 MG tablet Take 1 tablet (25 mg total) by mouth every 8 (eight) hours as needed for up to 30 doses. 02/16/20   Curatolo, Adam, DO  metFORMIN (GLUCOPHAGE) 500 MG tablet Take 1 tablet (500 mg total) by mouth 2 (two) times daily with a meal. For diabetes 02/19/17   Armandina StammerNwoko, Agnes I, NP  metoprolol tartrate (LOPRESSOR) 50 MG tablet Take 1 tablet (50 mg total) by mouth 2 (two) times daily. For high blood pressure 02/19/17   Nwoko, Nicole KindredAgnes I, NP  pravastatin (PRAVACHOL) 10 MG tablet Take 1 tablet (10 mg total) by mouth daily. For high cholesterol 02/19/17   Armandina StammerNwoko, Agnes I, NP    Allergies    Asa [aspirin], Bactrim [sulfamethoxazole-trimethoprim], Flagyl [metronidazole], Nsaids, Plavix [clopidogrel bisulfate], Sulfa antibiotics, and Sulfamethoxazole  Review of Systems   Review of Systems  Unable to perform ROS: Acuity of condition  Respiratory: Positive for chest tightness.   Neurological: Positive for seizures. Negative for headaches.    Physical Exam Updated Vital Signs BP 135/70   Pulse 80   Temp 97.9 F (36.6 C) (Oral)   Resp 16   SpO2 98%  Physical Exam Vitals and nursing note reviewed. Exam conducted with a chaperone present.  Constitutional:      General: She is not in acute distress.    Appearance: Normal appearance.  HENT:     Head: Normocephalic and atraumatic.     Nose: No rhinorrhea.  Eyes:     General:        Right eye: No discharge.        Left eye: No discharge.     Conjunctiva/sclera: Conjunctivae normal.  Cardiovascular:     Rate and Rhythm: Normal rate and regular rhythm.  Pulmonary:     Effort: Pulmonary effort is normal. No respiratory distress.     Breath sounds: No stridor.  Abdominal:     General: Abdomen is flat. There is no distension.     Palpations: Abdomen is soft.  Musculoskeletal:        General: No  tenderness or signs of injury.  Skin:    General: Skin is warm and dry.  Neurological:     General: No focal deficit present.     Mental Status: She is alert. Mental status is at baseline.     Motor: No weakness.     Comments: Patient has equal grip strength in upper extremities, claims to have weakness in the left however is able to move it, claims to have sensation decreased on the left but can feel when her leg is range and feel light touch.  No facial droop no slurred speech intermittent full body jerking behavior but able to speak during these episodes  Psychiatric:        Mood and Affect: Mood normal.        Behavior: Behavior normal.     ED Results / Procedures / Treatments   Labs (all labs ordered are listed, but only abnormal results are displayed) Labs Reviewed  COMPREHENSIVE METABOLIC PANEL - Abnormal; Notable for the following components:      Result Value   Potassium 3.0 (*)    Glucose, Bld 108 (*)    BUN 5 (*)    All other components within normal limits  CBG MONITORING, ED - Abnormal; Notable for the following components:   Glucose-Capillary 110 (*)    All other components within normal limits  I-STAT CHEM 8, ED - Abnormal; Notable for the following components:   Potassium 3.0 (*)    BUN 4 (*)    Glucose, Bld 103 (*)    All other components within normal limits  I-STAT BETA HCG BLOOD, ED ([facility], [facility], [facility] ONLY) - Abnormal; Notable for the following components:   I-stat hCG, quantitative 5.6 (*)    All other components within normal limits  PROTIME-INR  APTT  CBC  DIFFERENTIAL  RAPID URINE DRUG SCREEN, HOSP PERFORMED  CBG MONITORING, ED  TROPONIN I (HIGH SENSITIVITY)    EKG EKG Interpretation  Date/Time:  Wednesday October 03 2020 16:33:41 EST Ventricular Rate:  82 PR Interval:    QRS Duration: 82 QT Interval:  352 QTC Calculation: 412 R Axis:   30 Text Interpretation: Sinus rhythm ST elevation, consider inferior injury Confirmed by Cherlynn Perches ([id number]) on  10/03/2020 4:37:23 PM   Radiology CT Code Stroke CTA Head W/WO contrast  Result Date: 10/03/2020 CLINICAL DATA:  Stroke follow-up. Left-sided weakness and facial droop. EXAM: CT ANGIOGRAPHY HEAD AND NECK TECHNIQUE: Multidetector CT imaging of the head and neck was performed using the standard protocol during bolus administration of intravenous contrast. Multiplanar CT image reconstructions and MIPs  were obtained to evaluate the vascular anatomy. Carotid stenosis measurements (when applicable) are obtained utilizing NASCET criteria, using the distal internal carotid diameter as the denominator. CONTRAST:  80mL OMNIPAQUE IOHEXOL 350 MG/ML SOLN COMPARISON:  CTA September 26, 2016. FINDINGS: CTA NECK FINDINGS Aortic arch: Mild calcific atherosclerosis of the aorta. Great vessel origins are patent. Right carotid system: No evidence of dissection, stenosis (50% or greater) or occlusion. Left carotid system: No evidence of dissection, stenosis (50% or greater) or occlusion. Calcific atherosclerosis at the carotid bifurcation. Vertebral arteries: Left dominant. No evidence of dissection, stenosis (50% or greater) or occlusion. Skeleton: Focal left C4-C5 facet arthropathy.  No acute findings. Other neck: No suspicious adenopathy. Small subcentimeter right thyroid nodule which does not require further imaging follow-up per current guidelines. Symmetric prominence of the upper cervical chain lymph nodes is similar to the prior. Upper chest: No acute abnormality. Dependent atelectasis. Bronchial wall thickening. Review of the MIP images confirms the above findings CTA HEAD FINDINGS Anterior circulation: No significant stenosis, proximal occlusion, aneurysm, or vascular malformation. Left cavernous carotid calcified atherosclerosis without greater than 50% narrowing. Small left A1 ACA. Early left MCA bifurcation. Posterior circulation: No significant stenosis, proximal occlusion, aneurysm, or vascular malformation. Mild  narrowing of the left P1 PCA. Venous sinuses: As permitted by contrast timing, patent. Review of the MIP images confirms the above findings IMPRESSION: No emergent large vessel occlusion or other acute finding. No evidence of hemodynamically significant proximal arterial stenosis. Findings discussed with Dr. Wilford CornerArora via telephone at 4:10 p.m. Electronically Signed   By: Feliberto HartsFrederick S Jones MD   On: 10/03/2020 16:16   CT Code Stroke CTA Neck W/WO contrast  Result Date: 10/03/2020 CLINICAL DATA:  Stroke follow-up. Left-sided weakness and facial droop. EXAM: CT ANGIOGRAPHY HEAD AND NECK TECHNIQUE: Multidetector CT imaging of the head and neck was performed using the standard protocol during bolus administration of intravenous contrast. Multiplanar CT image reconstructions and MIPs were obtained to evaluate the vascular anatomy. Carotid stenosis measurements (when applicable) are obtained utilizing NASCET criteria, using the distal internal carotid diameter as the denominator. CONTRAST:  80mL OMNIPAQUE IOHEXOL 350 MG/ML SOLN COMPARISON:  CTA September 26, 2016. FINDINGS: CTA NECK FINDINGS Aortic arch: Mild calcific atherosclerosis of the aorta. Great vessel origins are patent. Right carotid system: No evidence of dissection, stenosis (50% or greater) or occlusion. Left carotid system: No evidence of dissection, stenosis (50% or greater) or occlusion. Calcific atherosclerosis at the carotid bifurcation. Vertebral arteries: Left dominant. No evidence of dissection, stenosis (50% or greater) or occlusion. Skeleton: Focal left C4-C5 facet arthropathy.  No acute findings. Other neck: No suspicious adenopathy. Small subcentimeter right thyroid nodule which does not require further imaging follow-up per current guidelines. Symmetric prominence of the upper cervical chain lymph nodes is similar to the prior. Upper chest: No acute abnormality. Dependent atelectasis. Bronchial wall thickening. Review of the MIP images confirms the  above findings CTA HEAD FINDINGS Anterior circulation: No significant stenosis, proximal occlusion, aneurysm, or vascular malformation. Left cavernous carotid calcified atherosclerosis without greater than 50% narrowing. Small left A1 ACA. Early left MCA bifurcation. Posterior circulation: No significant stenosis, proximal occlusion, aneurysm, or vascular malformation. Mild narrowing of the left P1 PCA. Venous sinuses: As permitted by contrast timing, patent. Review of the MIP images confirms the above findings IMPRESSION: No emergent large vessel occlusion or other acute finding. No evidence of hemodynamically significant proximal arterial stenosis. Findings discussed with Dr. Wilford CornerArora via telephone at 4:10 p.m. Electronically Signed   By: Gelene MinkFrederick  Barnett Applebaum MD   On: 10/03/2020 16:16   MR BRAIN WO CONTRAST  Result Date: 10/03/2020 CLINICAL DATA:  Left-sided weakness, code stroke follow-up EXAM: MRI HEAD WITHOUT CONTRAST TECHNIQUE: Multiplanar, multiecho pulse sequences of the brain and surrounding structures were obtained without intravenous contrast. COMPARISON:  None. FINDINGS: Brain: There is no acute infarction or intracranial hemorrhage. There is no intracranial mass, mass effect, or edema. There is no hydrocephalus or extra-axial fluid collection. Ventricles and sulci are normal in size and configuration. Minimal punctate foci of T2 hyperintensity in the supratentorial white matter nonspecific could reflect minor chronic microvascular ischemic changes or gliosis/demyelination of other etiologies. Vascular: Major vessel flow voids at the skull base are preserved. Skull and upper cervical spine: Normal marrow signal is preserved. Sinuses/Orbits: Patchy mucosal thickening.  Orbits are unremarkable. Other: Sella is unremarkable.  Mastoid air cells are clear. IMPRESSION: No evidence of recent infarction, hemorrhage, or mass. Electronically Signed   By: Guadlupe Spanish M.D.   On: 10/03/2020 19:07   EEG  adult  Result Date: 10/03/2020 Charlsie Quest, MD     10/03/2020  4:58 PM Patient Name: Venice Marcucci MRN: [id number] Epilepsy Attending: Charlsie Quest Referring Physician/Provider: Dr Milon Dikes Date: 10/03/2020 Duration: 27.22 mins Patient history: 55yo F with seizure like activity. EEG to evaluate for seizure Level of alertness: Awake, asleep AEDs during EEG study: versed Technical aspects: This EEG study was done with scalp electrodes positioned according to the 10-20 International system of electrode placement. Electrical activity was acquired at a sampling rate of 500Hz  and reviewed with a high frequency filter of 70Hz  and a low frequency filter of 1Hz . EEG data were recorded continuously and digitally stored. Description: No posterior dominant rhythm was seen. Sleep was characterized by sleep spindles (12-14hz ), maximal frontocentral region. There is an excessive amount of 15 to 18 Hz beta activity distributed symmetrically and diffusely. One event was noted at 1635 during which patient was noted to have non rhythmic right>left arm and shoulder tremor like movement. Patient was able to look at eeg tech when asked and able to follow commands like squeezing hand, able to state her name and date of birth. Event lasted for about 50 secods. Concomitant eeg before, during and after the event didn't show any eeg change.  Hyperventilation and photic stimulation were not performed.   ABNORMALITY -Excessive beta, generalized IMPRESSION: This study is within normal limits. No seizures or epileptiform discharges were seen throughout the recording. The excessive beta activity seen in the background is most likely due to the effect of benzodiazepine and is a benign EEG pattern. One event was noted at 1635 as described above without concomitant eeg change and was a NON epileptic event. Dr Wilford Corner was notified. Priyanka Annabelle Harman   CT ANGIO CHEST AORTA W/CM & OR WO/CM  Result Date: 10/03/2020 CLINICAL DATA:  Aortic  disease, nontraumatic hx of thoracic aortic hematoma, having chest pain EXAM: CT ANGIOGRAPHY CHEST WITH CONTRAST TECHNIQUE: Multidetector CT imaging of the chest was performed using the standard protocol during bolus administration of intravenous contrast. Multiplanar CT image reconstructions and MIPs were obtained to evaluate the vascular anatomy. CONTRAST:  60mL OMNIPAQUE IOHEXOL 350 MG/ML SOLN COMPARISON:  Chest CTA 09/09/2020, also 09/08/2020 FINDINGS: Cardiovascular: No noncontrast exam was obtained on the current exam. A previous area of abnormal thickening of the left posterolateral wall the descending thoracic aorta has diminished from prior exam, currently measuring 3 mm, previously 4 mm. There is no dissection flap. No periaortic stranding.  Mild calcified plaque in the transverse aorta and branch vessels. Conventional branching pattern from the aortic arch. No aortic aneurysm. No filling defects in the central most pulmonary arteries to the segmental level to suggest pulmonary embolus. The heart is normal in size. No pericardial effusion. Mediastinum/Nodes: No enlarged mediastinal or hilar lymph nodes. Small subcarinal and hilar lymph nodes have diminished from prior. No esophageal wall thickening. No visualized thyroid nodule. Lungs/Pleura: No focal consolidation or acute airspace disease. Resolved subpleural density in the right middle lobe and dependent lungs. Minimal apical emphysema. The trachea and central bronchi are patent. No pleural fluid. Upper Abdomen: No acute or unexpected findings. Musculoskeletal: There are no acute or suspicious osseous abnormalities. Review of the MIP images confirms the above findings. IMPRESSION: 1. The previous intramural hematoma of the left posterolateral wall of the descending thoracic aorta has diminished from prior exam, currently measuring 3 mm, previously 4 mm. No dissection flap or periaortic stranding. 2. No acute intrathoracic abnormality. Aortic  Atherosclerosis (ICD10-I70.0) and Emphysema (ICD10-J43.9). Electronically Signed   By: Narda Rutherford M.D.   On: 10/03/2020 20:57   CT HEAD CODE STROKE WO CONTRAST  Result Date: 10/03/2020 CLINICAL DATA:  Code stroke. Acute neuro deficit. Left-sided weakness. EXAM: CT HEAD WITHOUT CONTRAST TECHNIQUE: Contiguous axial images were obtained from the base of the skull through the vertex without intravenous contrast. COMPARISON:  None. FINDINGS: Brain: No evidence of acute infarction, hemorrhage, hydrocephalus, extra-axial collection or mass lesion/mass effect. Vascular: Negative for hyperdense vessel Skull: Negative Sinuses/Orbits: Mucosal edema paranasal sinuses.  Negative orbit Other: None ASPECTS (Alberta Stroke Program Early CT Score) - Ganglionic level infarction (caudate, lentiform nuclei, internal capsule, insula, M1-M3 cortex): 7 - Supraganglionic infarction (M4-M6 cortex): 3 Total score (0-10 with 10 being normal): 10 IMPRESSION: 1. Negative CT head 2. ASPECTS is 10 3. Code stroke imaging results were communicated on 10/03/2020 at 3:39 pm to provider Wilford Corner via text page Electronically Signed   By: Marlan Palau M.D.   On: 10/03/2020 15:40    Procedures .Critical Care Performed by: Sabino Donovan, MD Authorized by: Sabino Donovan, MD   Critical care provider statement:    Critical care time (minutes):  60   Critical care was necessary to treat or prevent imminent or life-threatening deterioration of the following conditions:  CNS failure or compromise (code stroke)   Critical care was time spent personally by me on the following activities:  Discussions with consultants, evaluation of patient's response to treatment, examination of patient, ordering and performing treatments and interventions, ordering and review of laboratory studies, ordering and review of radiographic studies, pulse oximetry, re-evaluation of patient's condition, obtaining history from patient or surrogate, review of old charts,  blood draw for specimens and development of treatment plan with patient or surrogate   (including critical care time)  Medications Ordered in ED Medications  sodium chloride flush (NS) 0.9 % injection 3 mL (has no administration in time range)  potassium chloride SA (KLOR-CON) CR tablet 40 mEq (has no administration in time range)  iohexol (OMNIPAQUE) 350 MG/ML injection 80 mL (80 mLs Intravenous Contrast Given 10/03/20 1555)  LORazepam (ATIVAN) injection 1 mg (1 mg Intravenous Given 10/03/20 1540)  iohexol (OMNIPAQUE) 350 MG/ML injection 60 mL (60 mLs Intravenous Contrast Given 10/03/20 2045)    ED Course  I have reviewed the triage vital signs and the nursing notes.  Pertinent labs & imaging results that were available during my care of the patient were reviewed by me and considered  in my medical decision making (see chart for details).    MDM Rules/Calculators/A&P                         Reported to have seizure-like activity at home, EMS gave 5 of Versed, here code stroke was called her airway was intact, neurology at bedside, patient complaining of unilateral weakness, inconsistent story, reports history of stroke.  Denies any substance abuse glucose is stable.  To CT scanner with neurology, neurology comments no new pathology seen.  Hoping to get MR, and do EEG to rule out seizure-like activity and other intracranial pathology.  Still waiting for other laboratory studies to come back to evaluate for metabolic disturbances.  I-STAT labs shows mild hypokalemia.  Potassium will be replaced.  I spoke with neurology, they reviewed the MRI and radiology read the MRI as well.  No acute intracranial abnormality was found with regards to new stroke.  Headache, and chest pain, review of the chart based on her history shows that she was seen by CT surgery for thoracic aorta hematoma and was going to have outpatient follow-up.  The pain is continued to get worse and they were planning on repeat CT  imaging.  I would like to CT this today to see if there is progression or worsening.  Potentially need for consult cardiothoracic surgery.  Patient agrees to this plan.  She understands she is already received IV contrast and she agrees that she needs repeat imaging.  Troponin still pending.  Urine drug screen still pending.  CT imaging reviewed by radiology shows improvement of the thoracic aorta hematoma.  Patient is sleeping and resting comfortably.  Patient has had this chest pain for some time now.  EKG was unremarkable for any acute ischemic change interval abnormality or arrhythmia.  Troponin is negative.  Patient is requesting discharge home.  She is requesting medication for sleep aid.  I told her to follow-up with her primary care provider or try over-the-counter medications.  She understands she still needs to follow-up with cardiothoracic surgery for this hematoma.  She is given return precautions.  Regarding seizure-like activity stroke and chest pain there is no findings on exam for any acute changes.  The neurology team feels these seizures to be nonepileptic, possibly related to stress anxiety, the patient does mention that she has a lot of this.  She is safe for discharge home strict return precautions given.  CRITICAL CARE Performed by: Sabino Donovan   Total critical care time: 60 minutes  Critical care time was exclusive of separately billable procedures and treating other patients.  Critical care was necessary to treat or prevent imminent or life-threatening deterioration.  Critical care was time spent personally by me on the following activities: development of treatment plan with patient and/or surrogate as well as nursing, discussions with consultants, evaluation of patient's response to treatment, examination of patient, obtaining history from patient or surrogate, ordering and performing treatments and interventions, ordering and review of laboratory studies, ordering and review  of radiographic studies, pulse oximetry and re-evaluation of patient's condition.   Final Clinical Impression(s) / ED Diagnoses Final diagnoses:  Seizure-like activity (HCC)  Chest pain, unspecified type    Rx / DC Orders ED Discharge Orders    None       Sabino Donovan, MD 10/03/20 2222

## 2020-10-03 NOTE — ED Notes (Signed)
Patient to MRI.

## 2020-10-03 NOTE — ED Notes (Signed)
Pt ambulatory to bathroom to change for DC

## 2020-10-03 NOTE — ED Notes (Signed)
Pt walked to bathroom 

## 2020-10-03 NOTE — Procedures (Signed)
Patient Name: Judy Schmidt  MRN: [id number]  Epilepsy Attending: Charlsie Quest  Referring Physician/Provider: Dr Milon Dikes Date: 10/03/2020 Duration: 27.22 mins  Patient history: 55yo F with seizure like activity. EEG to evaluate for seizure  Level of alertness: Awake, asleep  AEDs during EEG study: versed  Technical aspects: This EEG study was done with scalp electrodes positioned according to the 10-20 International system of electrode placement. Electrical activity was acquired at a sampling rate of 500Hz  and reviewed with a high frequency filter of 70Hz  and a low frequency filter of 1Hz . EEG data were recorded continuously and digitally stored.   Description: No posterior dominant rhythm was seen. Sleep was characterized by sleep spindles (12-14hz ), maximal frontocentral region. There is an excessive amount of 15 to 18 Hz beta activity distributed symmetrically and diffusely.   One event was noted at 1635 during which patient was noted to have non rhythmic right>left arm and shoulder tremor like movement. Patient was able to look at eeg tech when asked and able to follow commands like squeezing hand, able to state her name and date of birth. Event lasted for about 50 secods. Concomitant eeg before, during and after the event didn't show any eeg change.    Hyperventilation and photic stimulation were not performed.     ABNORMALITY -Excessive beta, generalized  IMPRESSION: This study is within normal limits. No seizures or epileptiform discharges were seen throughout the recording. The excessive beta activity seen in the background is most likely due to the effect of benzodiazepine and is a benign EEG pattern.  One event was noted at 1635 as described above without concomitant eeg change and was a NON epileptic event.   Dr was notified.    Larisha Vencill 

## 2020-10-03 NOTE — Progress Notes (Signed)
EEG complete - results pending 

## 2020-10-03 NOTE — Code Documentation (Addendum)
Pt is 55 yr old female brought in by EMS at 1520. She reportedly had an onset of numbness and weakness on left side at 1430 at which time she called her son to come help her. When he arrived she was having convulsions at which time he called EMS. When EMS arrived, pt was having more convulsions. 5 mg Versed given en route. Blood drawn and CBG (110) obtained at bridge. Airway cleared by EDP.See stroke timeline for NIHSS and specific times.Pt taken to CT at 1525. NCCT WNL per Dr Wilford Corner. CTA performed. Pt began jerking rythmicaly again on table. She did respond to nailbed pressure at that time. Ativan 1 mg given by [facility]. CTA neg for acute abnormality per Dr Wilford Corner. Pt returned to Trauma room A to finish work up. She will need q 2 hr VS and NIHSS. Bedside handoff with [facility]. Pt will not receive TPA as low suspicion for stroke. Pt not candidate for NIR as physical exam and CTA are negative for LVO.

## 2020-10-03 NOTE — ED Triage Notes (Signed)
Code stroke. Pt was lkn at 2:30. Pt started left sided weakness at around 2pm and called son. Son came over and called ems when ems arrived ems states pt had seizure.

## 2020-10-03 NOTE — Discharge Instructions (Addendum)
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  Return to [place] with any concerning changes.

## 2021-04-09 ENCOUNTER — Ambulatory Visit: Payer: Medicaid Other | Admitting: Neurology

## 2021-04-10 ENCOUNTER — Ambulatory Visit: Payer: Medicaid Other | Admitting: Neurology

## 2021-05-28 ENCOUNTER — Encounter: Payer: Self-pay | Admitting: Neurology

## 2021-05-28 ENCOUNTER — Ambulatory Visit: Payer: Medicaid Other | Admitting: Neurology

## 2021-05-28 VITALS — BP 139/76 | HR 99 | Ht 62.0 in | Wt 158.4 lb

## 2021-05-28 DIAGNOSIS — M5030 Other cervical disc degeneration, unspecified cervical region: Secondary | ICD-10-CM | POA: Diagnosis not present

## 2021-05-28 DIAGNOSIS — M5481 Occipital neuralgia: Secondary | ICD-10-CM | POA: Diagnosis not present

## 2021-05-28 DIAGNOSIS — M542 Cervicalgia: Secondary | ICD-10-CM | POA: Diagnosis not present

## 2021-05-28 MED ORDER — METHOCARBAMOL 500 MG PO TABS: 500.0000 mg | ORAL_TABLET | Freq: Four times a day (QID) | ORAL | 4 refills | Status: AC | PRN

## 2021-05-28 NOTE — Patient Instructions (Addendum)
I think the symptoms are coming from your neck, or so called CERVICALGIA Dr. Jordan Likes, [address] [place] [address], Needs ESI or Medial Branch Block or RFA any other modality as clinically warranted by Dr. Cyndia Diver team evaluate and treat  Start robaxin for musculoskeletal muscle pain. Also consider PT and dry needling.     Occipital Neuralgia Occipital neuralgia is a type of headache that causes brief episodes of very bad pain in the back of the head. Pain from occipital neuralgia may spread (radiate) to other parts of the head. These headaches may be caused by irritation of the nerves that leave the spinal cord high up in the neck, just below the base of the skull (occipital nerves). The occipital nerves transmit sensations from the back of the head, the top of the head, and the areas behind the ears. What are the causes? This condition can occur without any known cause (primary headache syndrome). In other cases, this condition is caused by pressure on or irritation of one of the two occipital nerves. Pressure and irritation may be due to: Muscle spasm in the neck. Neck injury. Wear and tear of the vertebrae in the neck (osteoarthritis). Disease of the disks that separate the vertebrae. Swollen blood vessels that put pressure on the occipital nerves. Infections. Tumors. Diabetes. What are the signs or symptoms? This condition causes brief burning, stabbing, electric, shocking, or shooting pain in the back of the head that can radiate to the top of the head. It can happen on one side or both sides of the head. It can also cause: Pain behind the eye. Pain triggered by neck movement or hair brushing. Scalp tenderness. Aching in the back of the head between episodes of very bad pain. Pain that gets worse with exposure to bright lights. How is this diagnosed? Your health care provider may diagnose the condition based on a physical exam and your symptoms. Tests may be done, such  as: Imaging studies of the brain and neck (cervical spine), such as an MRI or CT scan. These look for causes of pinched nerves. Applying pressure to the nerves in the neck to try to re-create the pain. Injection of numbing medicine into the occipital nerve areas to see if pain goes away (diagnostic nerve block). How is this treated? Treatment for this condition may begin with simple measures, such as: Rest. Massage. Applying heat or cold to the area. Over-the-counter pain relievers. If these measures do not work, you may need other treatments, including: Medicines, such as: Prescription-strength anti-inflammatory medicines. Muscle relaxants. Anti-seizure medicines, which can relieve pain. Antidepressants, which can relieve pain. Injected medicines, such as medicines that numb the area (local anesthetic) and steroids. Pulsed radiofrequency ablation. This is when wires are implanted to deliver electrical impulses that block pain signals from the occipital nerve. Surgery to relieve nerve pressure. Physical therapy. Follow these instructions at home: Managing pain   Avoid any activities that cause pain. Rest when you have an attack of pain. Try gentle massage to relieve pain. Try a different pillow or sleeping position. If directed, apply heat to the affected area as often as told by your health care provider. Use the heat source that your health care provider recommends, such as a moist heat pack or a heating pad. Place a towel between your skin and the heat source. Leave the heat on for 20-30 minutes. Remove the heat if your skin turns bright red. This is especially important if you are unable to feel pain,  heat, or cold. You have a greater risk of getting burned. If directed, put ice on the back of your head and neck area. To do this: Put ice in a plastic bag. Place a towel between your skin and the bag. Leave the ice on for 20 minutes, 2-3 times a day. Remove the ice if your skin  turns bright red. This is very important. If you cannot feel pain, heat, or cold, you have a greater risk of damage to the area. General instructions Take over-the-counter and prescription medicines only as told by your health care provider. Avoid things that make your symptoms worse, such as bright lights. Try to stay active. Get regular exercise that does not cause pain. Ask your health care provider to suggest safe exercises for you. Work with a physical therapist to learn stretching exercises you can do at home. Practice good posture. Keep all follow-up visits. This is important. Contact a health care provider if: Your medicine is not working. You have new or worsening symptoms. Get help right away if: You have very bad head pain that does not go away. You have a sudden change in vision, balance, or speech. These symptoms may represent a serious problem that is an emergency. Do not wait to see if the symptoms will go away. Get medical help right away. Call your local emergency services (911 in the [place] Do not drive yourself to the hospital. Summary Occipital neuralgia is a type of headache that causes brief episodes of very bad pain in the back of the head. Pain from occipital neuralgia may spread (radiate) to other parts of the head. Treatment for this condition includes rest, massage, and medicines. This information is not intended to replace advice given to you by your health care provider. Make sure you discuss any questions you have with your health care provider. Document Revised: 07/08/2020 Document Reviewed: 07/08/2020 Elsevier Patient Education  2022 Elsevier Inc.  Methocarbamol Tablets What is this medication? METHOCARBAMOL (meth oh KAR ba mole) treats muscle pain and stiffness. It works by calming overactive nerves in your body, which helps your muscles relax. It belongs to a group of medications called muscle relaxants. This medicine may be used for other purposes; ask  your health care provider or pharmacist if you have questions. COMMON BRAND NAME(S): Robaxin What should I tell my care team before I take this medication? They need to know if you have any of these conditions: Kidney disease Seizures An unusual or allergic reaction to methocarbamol, other medications, foods, dyes, or preservatives Pregnant or trying to get pregnant Breast-feeding How should I use this medication? Take this medication by mouth with a full glass of water. Follow the directions on the prescription label. Take your medication at regular intervals. Do not take your medication more often than directed. Talk to your care team about the use of this medication in children. Special care may be needed. Overdosage: If you think you have taken too much of this medicine contact a poison control center or emergency room at once. NOTE: This medicine is only for you. Do not share this medicine with others. What if I miss a dose? If you miss a dose, take it as soon as you can. If it is almost time for your next dose, take only the next dose. Do not take double or extra doses. What may interact with this medication? Do not take this medication with any of the following: Narcotic medications for cough This medication may also interact with  the following: Alcohol Antihistamines for allergy, cough and cold Certain medications for anxiety or sleep Certain medications for depression like amitriptyline, fluoxetine, sertraline Certain medications for seizures like phenobarbital, primidone Cholinesterase inhibitors like neostigmine, ambenonium, and pyridostigmine bromide General anesthetics like halothane, isoflurane, methoxyflurane, propofol Local anesthetics like lidocaine, pramoxine, tetracaine Medications that relax muscles for surgery Narcotic medications for pain Phenothiazines like chlorpromazine, mesoridazine, prochlorperazine, thioridazine This list may not describe all possible  interactions. Give your health care provider a list of all the medicines, herbs, non-prescription drugs, or dietary supplements you use. Also tell them if you smoke, drink alcohol, or use illegal drugs. Some items may interact with your medicine. What should I watch for while using this medication? Tell your care team if your symptoms do not start to get better or if they get worse. You may get drowsy or dizzy. Do not drive, use machinery, or do anything that needs mental alertness until you know how this medication affects you. Do not stand or sit up quickly, especially if you are an older patient. This reduces the risk of dizzy or fainting spells. Alcohol may interfere with the effect of this medication. Avoid alcoholic drinks. If you are taking another medication that also causes drowsiness, you may have more side effects. Give your care team a list of all medications you use. Your care team will tell you how much medication to take. Do not take more medication than directed. Call emergency for help if you have problems breathing or unusual sleepiness. What side effects may I notice from receiving this medication? Side effects that you should report to your care team as soon as possible: Allergic reactions-skin rash, itching, hives, swelling of the face, lips, tongue, or throat CNS depression-slow or shallow breathing, shortness of breath, feeling faint, dizziness, confusion, trouble staying awake Side effects that usually do not require medical attention (report to your care team if they continue or are bothersome): Dizziness Drowsiness Headache Metallic taste in mouth Upset stomach This list may not describe all possible side effects. Call your doctor for medical advice about side effects. You may report side effects to FDA at 1-800-FDA-1088. Where should I keep my medication? Keep out of the reach of children. Store at room temperature between 20 and 25 degrees C (68 and 77 degrees F). Keep  container tightly closed. Throw away any unused medication after the expiration date. NOTE: This sheet is a summary. It may not cover all possible information. If you have questions about this medicine, talk to your doctor, pharmacist, or health care provider.  2022 Elsevier/Gold Standard (2020-12-12 11:41:49)

## 2021-05-28 NOTE — Progress Notes (Signed)
[facility]    Provider:  Dr Lucia Gaskins Requesting Provider: Christia Reading, PA-C Primary Care Provider:  Elinor Dodge, MD  CC:  neck pain radiating into the back of the head  HPI:  Judy Schmidt is a 55 y.o. female here as requested by Christia Reading, PA-C for migraines. PMHx DM2, HLD, HTN, smoker, OSA.  I reviewed Judy Schmidt's notes, physical examination was unremarkable including chest and lung, cardiovascular, musculoskeletal, head and neck, she has excessive daytime sleepiness, witnessed apnea during sleep and gasping during sleep and snoring she is had a complete polysomnography, also snoring, it appears they are ordering a CPAP titration.  Patient has a history of migraines, she tested positive for COVID the first time in December of last year, she has daily migraine headaches since then, they have persisted, she was in the ER in January due to witnessed seizure activity, CT scan of the head was ordered, she was referred to neurology, she also has chronic insomnia, cocaine abuse in remission, headache, neck pain, reports a TIA in the past for started on Plavix but had side effects, neck pain, chronic low back pain, panic attack, current every day smoker.  Started last year with covid, the back of her head is constantly hurting,  over a year, it is a pain in the back of her head, pain that is sharp and shoots down the neck, the back of her head and neck, a nagging hard pain in the back f the head and she has pinched nerves in her neck. Never in the forehead or behind the eyes, more with neck pain and in the back of the head bilaterally, no nausea, light/sound sensitivity, no other migrainous features. She has some arm pain and generalized pain. Hurts all the time and can be brief shooting pains with pain in between always in the neck and back of the head and neck.   She also has diabetic neuropathy and has an emg/ncs scheduled someplace.she just started  using her cpap in the last month,  Reviewed notes, labs and imaging from outside physicians, which showed:  MRI 04/19/2021: MRI cervical spine reviewed report  C2-3: Mild facet spurring   C3-4: Bulky left-sided facet spurring and asymmetric left  uncovertebral ridging and disc bulging. Left foraminal impingement   C4-5: Facet osteoarthritis with bulky left-sided spurring and mild  anterolisthesis. Mild uncovertebral ridging and disc bulging. Mild  canal and bilateral foraminal stenosis   C5-6: Disc bulging and uncovertebral ridging. Bilateral facet  spurring. Biforaminal impingement and mild spinal stenosis   C6-7: Disc bulging and mild facet spurring. Asymmetric left  uncovertebral ridging with probable associated protrusion. Left  foraminal impingement   C7-T1:Unremarkable.   IMPRESSION:  1. Multilevel cervical spine degeneration with mild C4-5  anterolisthesis.  2. Left-sided foraminal impingement at C3-4, C5-6, and C6-7.  3. Right foraminal impingement at C5-6.  4. Up to mild spinal stenosis at C5-6.   From a thorough review of records, medications tried that can be used in headache treatment include: Amlodipine, nortriptyline, metoprolol, gabapentin, qulipita started in April 2022  MRI brain 10/03/2020: reviewed images, normal. FINDINGS: Brain: There is no acute infarction or intracranial hemorrhage. There is no intracranial mass, mass effect, or edema. There is no hydrocephalus or extra-axial fluid collection. Ventricles and sulci are normal in size and configuration. Minimal punctate foci of T2 hyperintensity in the supratentorial white matter nonspecific could reflect minor chronic microvascular ischemic changes or gliosis/demyelination of other etiologies.   Vascular: Major  vessel flow voids at the skull base are preserved.   Skull and upper cervical spine: Normal marrow signal is preserved.   Sinuses/Orbits: Patchy mucosal thickening.  Orbits are unremarkable.    Other: Sella is unremarkable.  Mastoid air cells are clear.   IMPRESSION: No evidence of recent infarction, hemorrhage, or mass.  CTA H&N: reviewed reports, unremarkable  IMPRESSION: No emergent large vessel occlusion or other acute finding. No evidence of hemodynamically significant proximal arterial stenosis.   Findings discussed with Dr. Wilford Corner via telephone at 4:10 p.m.  04/2019 [id number] 6, 10/03/2020 BUN 5 creat 0.74  Review of Systems: Patient complains of symptoms per HPI as well as the following symptoms neck pain. Pertinent negatives and positives per HPI. All others negative.   Social History   Socioeconomic History   Marital status: Single    Spouse name: Not on file   Number of children: Not on file   Years of education: Not on file   Highest education level: Not on file  Occupational History   Not on file  Tobacco Use   Smoking status: Every Day    Packs/day: 0.50    Types: Cigarettes   Smokeless tobacco: Never  Substance and Sexual Activity   Alcohol use: Yes    Alcohol/week: 1.0 standard drink    Types: 1 Standard drinks or equivalent per week    Comment: RARE   Drug use: No   Sexual activity: Not on file  Other Topics Concern   Not on file  Social History Narrative   Caffeine twice weekly.     Social Determinants of Health   Financial Resource Strain: Not on file  Food Insecurity: Not on file  Transportation Needs: Not on file  Physical Activity: Not on file  Stress: Not on file  Social Connections: Not on file  Intimate Partner Violence: Not on file    Family History  Problem Relation Age of Onset   Migraines Paternal Grandmother     Past Medical History:  Diagnosis Date   Bulging lumbar disc    Cocaine abuse (HCC)    COPD (chronic obstructive pulmonary disease) (HCC)    Depression    Diabetes mellitus without complication (HCC)    Gall stones    High cholesterol    Hyperlipemia    Hypertension    Sciatica    Thyroid disease      Patient Active Problem List   Diagnosis Date Noted   Cervicalgia 05/28/2021   Degenerative disc disease, cervical 05/28/2021   Bilateral occipital neuralgia 05/28/2021   Cocaine abuse with cocaine-induced mood disorder (HCC) 02/18/2017   MDD (major depressive disorder) 02/17/2017    Past Surgical History:  Procedure Laterality Date   HERNIA REPAIR     KNEE SURGERY     TUBAL LIGATION      Current Outpatient Medications  Medication Sig Dispense Refill   amLODipine (NORVASC) 10 MG tablet Take 1 tablet (10 mg total) by mouth daily. For high blood pressure     famotidine (PEPCID) 20 MG tablet Take 1 tablet (20 mg total) by mouth 2 (two) times daily. 30 tablet 0   hydrOXYzine (ATARAX/VISTARIL) 25 MG tablet Take 1 tablet (25 mg total) by mouth every 8 (eight) hours as needed for up to 30 doses. 30 tablet 0   metFORMIN (GLUCOPHAGE) 500 MG tablet Take 1 tablet (500 mg total) by mouth 2 (two) times daily with a meal. For diabetes     methocarbamol (ROBAXIN) 500 MG tablet Take 1  tablet (500 mg total) by mouth every 6 (six) hours as needed for muscle spasms. 90 tablet 4   metoprolol tartrate (LOPRESSOR) 50 MG tablet Take 1 tablet (50 mg total) by mouth 2 (two) times daily. For high blood pressure     omeprazole (PRILOSEC) 40 MG capsule Take by mouth.     pravastatin (PRAVACHOL) 10 MG tablet Take 1 tablet (10 mg total) by mouth daily. For high cholesterol     pregabalin (LYRICA) 50 MG capsule Take 50 mg by mouth 3 (three) times daily.     temazepam (RESTORIL) 15 MG capsule Take by mouth.     No current facility-administered medications for this visit.    Allergies as of 05/28/2021 - Review Complete 05/28/2021  Allergen Reaction Noted   Asa [aspirin]  01/10/2014   Bactrim [sulfamethoxazole-trimethoprim]  01/10/2014   Flagyl [metronidazole]  01/10/2014   Gabapentin & lidocaine-menthol  05/28/2021   Nsaids Other (See Comments) 06/21/2014   Plavix [clopidogrel bisulfate]  02/14/2017    Sulfa antibiotics  01/10/2014   Sulfamethoxazole Rash 06/21/2014    Vitals: BP 139/76   Pulse 99   Ht 5\' 2"  (1.575 m)   Wt 158 lb 6.4 oz (71.8 kg)   BMI 28.97 kg/m  Last Weight:  Wt Readings from Last 1 Encounters:  05/28/21 158 lb 6.4 oz (71.8 kg)   Last Height:   Ht Readings from Last 1 Encounters:  05/28/21 5\' 2"  (1.575 m)     Physical exam: Exam: Gen: NAD, conversant, well nourised, well groomed                     CV: RRR, no MRG. No Carotid Bruits. No peripheral edema, warm, nontender Eyes: Conjunctivae clear without exudates or hemorrhage  Neuro: Detailed Neurologic Exam  Speech:    Speech is normal; fluent and spontaneous with normal comprehension.  Cognition:    The patient is oriented to person, place, and time;     recent and remote memory intact;     language fluent;     normal attention, concentration,     fund of knowledge Cranial Nerves:    The pupils are equal, round, and reactive to light. Pupils too small to visualize fundi Visual fields are full to finger confrontation. Extraocular movements are intact. Trigeminal sensation is intact and the muscles of mastication are normal. The face is symmetric. The palate elevates in the midline. Hearing intact. Voice is normal. Shoulder shrug is normal. The tongue has normal motion without fasciculations.   Coordination:    Normal  Gait:    normal.   Motor Observation:    No asymmetry, no atrophy, and no involuntary movements noted. Tone:    Normal muscle tone.    Posture:    Posture is normal. normal erect    Strength:    Strength is V/V in the upper and lower limbs.      Sensation: intact to LT     Reflex Exam:  DTR's:    Deep tendon reflexes in the upper and lower extremities are normal bilaterally.   Toes:    The toes are downgoing bilaterally.   Clonus:    Clonus is absent.    Assessment/Plan:  Patient with degenerative neck disease, neck pain and occipital pain. Cervicalgia with  occipital nerve irritation, cervicomuscular neck pain and cervicalgia in the setting of degenerative cervical disease. She has pain on palpation of the cervical muscles and at the emergence of the occipital nerves. Again, no  migrainous features.   Started last year with covid, the back of her head is constantly hurting,  over a year, it is a pain in the back of her head, pain that is sharp and shoots down the neck, the back of her head and neck, a nagging hard pain in the back. Never in the forehead or behind the eyes, more with neck pain and in the back of the head bilaterally, no nausea,no light/sound sensitivity, no other migrainous features. She has some arm pain and generalized pain. Hurts all the time and can be brief shooting pains with pain in between always in the neck and back of the head and neck. See MRI results above.   Dr. Jordan Likes, [address] [place] [address], Needs ESI or Medial Branch Block or RFA any other modality as clinically warranted by Dr. Cyndia Diver team evaluate and treat  Start robaxin for musculoskeletal muscle pain. Also consider PT and dry needling.   Orders Placed This Encounter  Procedures   Ambulatory referral to Pain Clinic    Meds ordered this encounter  Medications   methocarbamol (ROBAXIN) 500 MG tablet    Sig: Take 1 tablet (500 mg total) by mouth every 6 (six) hours as needed for muscle spasms.    Dispense:  90 tablet    Refill:  4     Cc: Burna Sis,  Sanchez-Brugal, Rockey Situ, MD  Naomie Dean, MD  [facility] [address] [place], [place] [address]  Phone [phone] Fax [phone]

## 2021-05-29 ENCOUNTER — Telehealth: Payer: Self-pay | Admitting: Neurology

## 2021-05-29 NOTE — Telephone Encounter (Signed)
Referral for Dr. Spivey sent to [facility]. Phone: [phone]. 

## 2021-10-28 ENCOUNTER — Other Ambulatory Visit (HOSPITAL_BASED_OUTPATIENT_CLINIC_OR_DEPARTMENT_OTHER): Payer: Self-pay

## 2021-10-28 ENCOUNTER — Emergency Department (HOSPITAL_BASED_OUTPATIENT_CLINIC_OR_DEPARTMENT_OTHER)
Admission: EM | Admit: 2021-10-28 | Discharge: 2021-10-28 | Disposition: A | Discharge status: Home / Self Care | Payer: Medicaid Other | Attending: Emergency Medicine | Admitting: Emergency Medicine

## 2021-10-28 ENCOUNTER — Encounter (HOSPITAL_BASED_OUTPATIENT_CLINIC_OR_DEPARTMENT_OTHER): Payer: Self-pay

## 2021-10-28 ENCOUNTER — Emergency Department (HOSPITAL_BASED_OUTPATIENT_CLINIC_OR_DEPARTMENT_OTHER): Payer: Medicaid Other

## 2021-10-28 ENCOUNTER — Other Ambulatory Visit: Payer: Self-pay

## 2021-10-28 DIAGNOSIS — E119 Type 2 diabetes mellitus without complications: Secondary | ICD-10-CM | POA: Diagnosis not present

## 2021-10-28 DIAGNOSIS — Z7984 Long term (current) use of oral hypoglycemic drugs: Secondary | ICD-10-CM | POA: Diagnosis not present

## 2021-10-28 DIAGNOSIS — J449 Chronic obstructive pulmonary disease, unspecified: Secondary | ICD-10-CM | POA: Insufficient documentation

## 2021-10-28 DIAGNOSIS — J101 Influenza due to other identified influenza virus with other respiratory manifestations: Secondary | ICD-10-CM | POA: Diagnosis not present

## 2021-10-28 DIAGNOSIS — E876 Hypokalemia: Secondary | ICD-10-CM

## 2021-10-28 DIAGNOSIS — Z79899 Other long term (current) drug therapy: Secondary | ICD-10-CM | POA: Insufficient documentation

## 2021-10-28 DIAGNOSIS — I10 Essential (primary) hypertension: Secondary | ICD-10-CM | POA: Diagnosis not present

## 2021-10-28 DIAGNOSIS — L729 Follicular cyst of the skin and subcutaneous tissue, unspecified: Secondary | ICD-10-CM | POA: Insufficient documentation

## 2021-10-28 DIAGNOSIS — Z20822 Contact with and (suspected) exposure to covid-19: Secondary | ICD-10-CM | POA: Insufficient documentation

## 2021-10-28 DIAGNOSIS — R059 Cough, unspecified: Secondary | ICD-10-CM | POA: Diagnosis present

## 2021-10-28 LAB — CBC WITH DIFFERENTIAL/PLATELET
Abs Immature Granulocytes: 0.01 10*3/uL (ref 0.00–0.07)
Basophils Absolute: 0 10*3/uL (ref 0.0–0.1)
Basophils Relative: 0 %
Eosinophils Absolute: 0.1 10*3/uL (ref 0.0–0.5)
Eosinophils Relative: 1 %
HCT: 38.3 % (ref 36.0–46.0)
Hemoglobin: 12.7 g/dL (ref 12.0–15.0)
Immature Granulocytes: 0 %
Lymphocytes Relative: 52 %
Lymphs Abs: 2.7 10*3/uL (ref 0.7–4.0)
MCH: 26.7 pg (ref 26.0–34.0)
MCHC: 33.2 g/dL (ref 30.0–36.0)
MCV: 80.5 fL (ref 80.0–100.0)
Monocytes Absolute: 0.6 10*3/uL (ref 0.1–1.0)
Monocytes Relative: 11 %
Neutro Abs: 1.9 10*3/uL (ref 1.7–7.7)
Neutrophils Relative %: 36 %
Platelets: 268 10*3/uL (ref 150–400)
RBC: 4.76 MIL/uL (ref 3.87–5.11)
RDW: 15.7 % — ABNORMAL HIGH (ref 11.5–15.5)
WBC: 5.3 10*3/uL (ref 4.0–10.5)
nRBC: 0 % (ref 0.0–0.2)

## 2021-10-28 LAB — BASIC METABOLIC PANEL
Anion gap: 10 (ref 5–15)
BUN: 10 mg/dL (ref 6–20)
CO2: 23 mmol/L (ref 22–32)
Calcium: 9 mg/dL (ref 8.9–10.3)
Chloride: 105 mmol/L (ref 98–111)
Creatinine, Ser: 0.68 mg/dL (ref 0.44–1.00)
GFR, Estimated: >60 mL/min (ref >60)
Glucose, Bld: 83 mg/dL (ref 70–99)
Potassium: 3 mmol/L — ABNORMAL LOW (ref 3.5–5.1)
Sodium: 138 mmol/L (ref 135–145)

## 2021-10-28 LAB — RESP PANEL BY RT-PCR (FLU A&B, COVID) ARPGX2
Influenza A by PCR: POSITIVE — AB
Influenza B by PCR: NEGATIVE
SARS Coronavirus 2 by RT PCR: NEGATIVE

## 2021-10-28 MED ORDER — ONDANSETRON 4 MG PO TBDP
4.0000 mg | ORAL_TABLET | Freq: Three times a day (TID) | ORAL | 1 refills | Status: AC | PRN
  Filled 2021-10-28: qty 12, 4d supply, fill #0

## 2021-10-28 MED ORDER — POTASSIUM CHLORIDE CRYS ER 20 MEQ PO TBCR
20.0000 meq | EXTENDED_RELEASE_TABLET | Freq: Two times a day (BID) | ORAL | 0 refills | Status: AC
  Filled 2021-10-28: qty 6, 3d supply, fill #0

## 2021-10-28 NOTE — ED Notes (Signed)
Pt endorses recent negative covid test

## 2021-10-28 NOTE — ED Provider Notes (Signed)
[facility] EMERGENCY DEPARTMENT Provider Note   CSN: [id number] Arrival date & time: 10/28/21  1117     History  Chief Complaint  Patient presents with   Back Pain    Judy Schmidt is a 56 y.o. female.  With complaint of left sided back pain but also on left side of the chest.  Made worse by coughing.  Patient's had a cough since Thursday occasionally productive.  Had a negative COVID test at [facility].  Complaint of pain to head and right ear.  Denies any nausea vomiting or diarrhea.  Denies any fevers.  Also patient denies any radiation of the pain into her left leg.  Denies any numbness or weakness to her lower extremities.  Past medical history significant for hyperlipidemia COPD diabetes hypertension past history of cocaine abuse      Home Medications Prior to Admission medications   Medication Sig Start Date End Date Taking? Authorizing Provider  amLODipine (NORVASC) 10 MG tablet Take 1 tablet (10 mg total) by mouth daily. For high blood pressure 02/19/17   Nwoko, Herbert Pun I, NP  famotidine (PEPCID) 20 MG tablet Take 1 tablet (20 mg total) by mouth 2 (two) times daily. 06/21/18   Virgel Manifold, MD  hydrOXYzine (ATARAX/VISTARIL) 25 MG tablet Take 1 tablet (25 mg total) by mouth every 8 (eight) hours as needed for up to 30 doses. 02/16/20   Curatolo, Adam, DO  metFORMIN (GLUCOPHAGE) 500 MG tablet Take 1 tablet (500 mg total) by mouth 2 (two) times daily with a meal. For diabetes 02/19/17   Lindell Spar I, NP  methocarbamol (ROBAXIN) 500 MG tablet Take 1 tablet (500 mg total) by mouth every 6 (six) hours as needed for muscle spasms. 05/28/21   Melvenia Beam, MD  metoprolol tartrate (LOPRESSOR) 50 MG tablet Take 1 tablet (50 mg total) by mouth 2 (two) times daily. For high blood pressure 02/19/17   Lindell Spar I, NP  omeprazole (PRILOSEC) 40 MG capsule Take by mouth. 09/10/20   [provider]  pravastatin (PRAVACHOL) 10 MG tablet Take 1 tablet (10 mg total) by  mouth daily. For high cholesterol 02/19/17   Nwoko, Herbert Pun I, NP  pregabalin (LYRICA) 50 MG capsule Take 50 mg by mouth 3 (three) times daily.    [provider]  temazepam (RESTORIL) 15 MG capsule Take by mouth. 03/18/21   [provider]      Allergies    Asa [aspirin], Bactrim [sulfamethoxazole-trimethoprim], Flagyl [metronidazole], Gabapentin & lidocaine-menthol, Nsaids, Plavix [clopidogrel bisulfate], Sulfa antibiotics, and Sulfamethoxazole    Review of Systems   Review of Systems  Constitutional:  Negative for chills and fever.  HENT:  Positive for congestion. Negative for ear pain and sore throat.   Eyes:  Negative for pain and visual disturbance.  Respiratory:  Positive for cough. Negative for shortness of breath.   Cardiovascular:  Positive for chest pain. Negative for palpitations.  Gastrointestinal:  Negative for abdominal pain and vomiting.  Genitourinary:  Negative for dysuria and hematuria.  Musculoskeletal:  Positive for back pain. Negative for arthralgias.  Skin:  Negative for color change and rash.  Neurological:  Negative for seizures and syncope.  All other systems reviewed and are negative.  Physical Exam Updated Vital Signs BP 140/88    Pulse 65    Temp 98.6 F (37 C) (Oral)    Resp 17    Ht 1.6 m (5\' 3" )    Wt 71.7 kg    SpO2 97%  BMI 27.99 kg/m  Physical Exam Vitals and nursing note reviewed.  Constitutional:      General: She is not in acute distress.    Appearance: Normal appearance. She is well-developed.  HENT:     Head: Normocephalic and atraumatic.     Right Ear: Tympanic membrane normal.     Left Ear: Tympanic membrane normal.     Ears:     Comments: Right pinna has a skin cyst on it measuring about 3 mm.  That is tender.  Canal and tympanic membrane are normal.    Mouth/Throat:     Mouth: Mucous membranes are moist.  Eyes:     Extraocular Movements: Extraocular movements intact.     Conjunctiva/sclera: Conjunctivae normal.      Pupils: Pupils are equal, round, and reactive to light.  Cardiovascular:     Rate and Rhythm: Normal rate and regular rhythm.     Heart sounds: No murmur heard. Pulmonary:     Effort: Pulmonary effort is normal. No respiratory distress.     Breath sounds: Normal breath sounds. No wheezing, rhonchi or rales.  Chest:     Chest wall: No tenderness.  Abdominal:     Palpations: Abdomen is soft.     Tenderness: There is no abdominal tenderness.  Musculoskeletal:        General: No swelling.     Cervical back: Normal range of motion and neck supple.  Skin:    General: Skin is warm and dry.     Capillary Refill: Capillary refill takes less than 2 seconds.  Neurological:     General: No focal deficit present.     Mental Status: She is alert and oriented to person, place, and time.     Cranial Nerves: No cranial nerve deficit.     Sensory: No sensory deficit.     Motor: No weakness.  Psychiatric:        Mood and Affect: Mood normal.    ED Results / Procedures / Treatments   Labs (all labs ordered are listed, but only abnormal results are displayed) Labs Reviewed  RESP PANEL BY RT-PCR (FLU A&B, COVID) ARPGX2 - Abnormal; Notable for the following components:      Result Value   Influenza A by PCR POSITIVE (*)    All other components within normal limits  CBC WITH DIFFERENTIAL/PLATELET - Abnormal; Notable for the following components:   RDW 15.7 (*)    All other components within normal limits  BASIC METABOLIC PANEL - Abnormal; Notable for the following components:   Potassium 3.0 (*)    All other components within normal limits    EKG EKG Interpretation  Date/Time:  Monday October 28 2021 11:55:14 EST Ventricular Rate:  86 PR Interval:  150 QRS Duration: 74 QT Interval:  388 QTC Calculation: 464 R Axis:   66 Text Interpretation: Normal sinus rhythm Normal ECG When compared with ECG of 03-Oct-2020 16:33, PREVIOUS ECG IS PRESENT Confirmed by Fredia Sorrow [phone]) on  10/28/2021 12:27:03 PM  Radiology DG Chest Port 1 View  Result Date: 10/28/2021 CLINICAL DATA:  Cough, chest pain EXAM: PORTABLE CHEST 1 VIEW COMPARISON:  Portable exam 1250 hours compared to 09/08/2020 FINDINGS: Normal heart size, mediastinal contours, and pulmonary vascularity. Atherosclerotic calcification aorta. Lungs clear. No pulmonary infiltrate, pleural effusion, or pneumothorax. Osseous structures unremarkable. IMPRESSION: No acute abnormalities. Aortic Atherosclerosis (ICD10-I70.0). Electronically Signed   By: Lavonia Dana M.D.   On: 10/28/2021 13:01    Procedures Procedures  Medications Ordered in ED Medications - No data to display  ED Course/ Medical Decision Making/ A&P                           Medical Decision Making Amount and/or Complexity of Data Reviewed Labs: ordered. Radiology: ordered.  Patient's influenza A is positive.  Explains her symptoms.  Her left back pain left sided chest wall pain is due to coughing.  Chest x-ray negative for pneumonia.  No signs of any ear infection.  Lungs are clear bilaterally.  Will treat symptomatically.  Sodium normal at 138 kidney function normal.  Potassium low at 3.0.  We will do potassium supplementation.  CBC White blood cell count 5.3 and hemoglobin is 12.7.  Patient with onset of symptoms Friday.  So out of the window for Tamiflu.  And plus patient not particularly toxic.    We will treat with Zofran for the nausea and potassium orally for the hypokalemia.  And then over-the-counter cold and flu medicine.   Final Clinical Impression(s) / ED Diagnoses Final diagnoses:  Influenza A  Hypokalemia    Rx / DC Orders ED Discharge Orders     None         Fredia Sorrow, MD 10/28/21 1445

## 2021-10-28 NOTE — Discharge Instructions (Addendum)
Work-up positive for influenza A.  Take Tylenol for the body aches.  Take over-the-counter cold and cough medicines.  Take Zofran for nausea.  As we discussed your potassium was low.  Take the potassium supplement as directed.  Make an appointment follow-up with your regular doctor for any new or worse symptoms.  Or return here for any new or worse symptoms.

## 2021-10-28 NOTE — ED Triage Notes (Signed)
Pt arrives to triage with pain with ambulation states that she is having pain in her back and left side into her chest since Friday. Denies any injury to the area. Pt reports she has had a cough since Thursday. did a home COVID test that was negative. Also c/o pain to head and right ear.

## 2021-10-28 NOTE — ED Notes (Signed)
ED Provider at bedside. 

## 2021-10-28 NOTE — ED Notes (Signed)
Ed Provider aware pt is requesting him to lok in rt. Ear and wants something for pain

## 2022-01-14 ENCOUNTER — Other Ambulatory Visit: Payer: Self-pay | Admitting: Neurology
